# Patient Record
Sex: Female | Born: 1985 | Marital: Married | Smoking: Never smoker
Health system: Southern US, Community
[De-identification: ages and names within clinical notes are randomized; demographics above are authoritative.]

---

## 2015-08-18 DIAGNOSIS — K589 Irritable bowel syndrome without diarrhea: Secondary | ICD-10-CM | POA: Insufficient documentation

## 2016-10-17 DIAGNOSIS — L409 Psoriasis, unspecified: Secondary | ICD-10-CM | POA: Insufficient documentation

## 2018-01-27 ENCOUNTER — Ambulatory Visit: Payer: Self-pay | Admitting: Family Medicine

## 2018-01-27 VITALS — BP 95/75 | HR 77 | Temp 98.2°F | Resp 18 | Wt 161.6 lb

## 2018-01-27 DIAGNOSIS — R05 Cough: Secondary | ICD-10-CM

## 2018-01-27 DIAGNOSIS — R059 Cough, unspecified: Secondary | ICD-10-CM

## 2018-01-27 DIAGNOSIS — R0789 Other chest pain: Secondary | ICD-10-CM

## 2018-01-27 MED ORDER — MONTELUKAST SODIUM 10 MG PO TABS: 10.0000 mg | ORAL_TABLET | Freq: Every day | ORAL | 0 refills | Status: DC

## 2018-01-27 NOTE — Patient Instructions (Addendum)
PLAN< Obtain Primary Care Provider for eval and treatment  Allergic Rhinitis, Adult Allergic rhinitis is an allergic reaction that affects the mucous membrane inside the nose. It causes sneezing, a runny or stuffy nose, and the feeling of mucus going down the back of the throat (postnasal drip). Allergic rhinitis can be mild to severe. There are two types of allergic rhinitis:  Seasonal. This type is also called hay fever. It happens only during certain seasons.  Perennial. This type can happen at any time of the year.  What are the causes? This condition happens when the body's defense system (immune system) responds to certain harmless substances called allergens as though they were germs.  Seasonal allergic rhinitis is triggered by pollen, which can come from grasses, trees, and weeds. Perennial allergic rhinitis may be caused by:  House dust mites.  Pet dander.  Mold spores.  What are the signs or symptoms? Symptoms of this condition include:  Sneezing.  Runny or stuffy nose (nasal congestion).  Postnasal drip.  Itchy nose.  Tearing of the eyes.  Trouble sleeping.  Daytime sleepiness.  How is this diagnosed? This condition may be diagnosed based on:  Your medical history.  A physical exam.  Tests to check for related conditions, such as: ? Asthma. ? Pink eye. ? Ear infection. ? Upper respiratory infection.  Tests to find out which allergens trigger your symptoms. These may include skin or blood tests.  How is this treated? There is no cure for this condition, but treatment can help control symptoms. Treatment may include:  Taking medicines that block allergy symptoms, such as antihistamines. Medicine may be given as a shot, nasal spray, or pill.  Avoiding the allergen.  Desensitization. This treatment involves getting ongoing shots until your body becomes less sensitive to the allergen. This treatment may be done if other treatments do not help.  If  taking medicine and avoiding the allergen does not work, new, stronger medicines may be prescribed.  Follow these instructions at home:  Find out what you are allergic to. Common allergens include smoke, dust, and pollen.  Avoid the things you are allergic to. These are some things you can do to help avoid allergens: ? Replace carpet with wood, tile, or vinyl flooring. Carpet can trap dander and dust. ? Do not smoke. Do not allow smoking in your home. ? Change your heating and air conditioning filter at least once a month. ? During allergy season:  Keep windows closed as much as possible.  Plan outdoor activities when pollen counts are lowest. This is usually during the evening hours.  When coming indoors, change clothing and shower before sitting on furniture or bedding.  Take over-the-counter and prescription medicines only as told by your health care provider.  Keep all follow-up visits as told by your health care provider. This is important. Contact a health care provider if:  You have a fever.  You develop a persistent cough.  You make whistling sounds when you breathe (you wheeze).  Your symptoms interfere with your normal daily activities. Get help right away if:  You have shortness of breath. Summary  This condition can be managed by taking medicines as directed and avoiding allergens.  Contact your health care provider if you develop a persistent cough or fever.  During allergy season, keep windows closed as much as possible. This information is not intended to replace advice given to you by your health care provider. Make sure you discuss any questions you have with  your health care provider. Document Released: 02/28/2001 Document Revised: 07/13/2016 Document Reviewed: 07/13/2016 Elsevier Interactive Patient Education  2018 [employer] Chest Wall Pain Chest wall pain is pain in or around the bones and muscles of your chest. Sometimes, an injury causes this  pain. Sometimes, the cause may not be known. This pain may take several weeks or longer to get better. Follow these instructions at home: Pay attention to any changes in your symptoms. Take these actions to help with your pain:  Rest as told by your doctor.  Avoid activities that cause pain. Try not to use your chest, belly (abdominal), or side muscles to lift heavy things.  If directed, apply ice to the painful area: ? Put ice in a plastic bag. ? Place a towel between your skin and the bag. ? Leave the ice on for 20 minutes, 2-3 times per day.  Take over-the-counter and prescription medicines only as told by your doctor.  Do not use tobacco products, including cigarettes, chewing tobacco, and e-cigarettes. If you need help quitting, ask your doctor.  Keep all follow-up visits as told by your doctor. This is important.  Contact a doctor if:  You have a fever.  Your chest pain gets worse.  You have new symptoms. Get help right away if:  You feel sick to your stomach (nauseous) or you throw up (vomit).  You feel sweaty or light-headed.  You have a cough with phlegm (sputum) or you cough up blood.  You are short of breath. This information is not intended to replace advice given to you by your health care provider. Make sure you discuss any questions you have with your health care provider. Document Released: 11/22/2007 Document Revised: 11/11/2015 Document Reviewed: 08/31/2014 Elsevier Interactive Patient Education  [employer].

## 2018-01-27 NOTE — Progress Notes (Signed)
Amber MortonJasmine Luna is a 32 y.o. female who presents today with concerns of cough that has been persistent for approximately 1 month and then intermittent chest pain that has been intermittent for 6 weeks. She denies any trauma, increase in physical activity, GERD or recent illness or medication treatment.  Review of Systems  Constitutional: Negative for chills, fever and malaise/fatigue.  HENT: Negative for congestion, ear discharge, ear pain, sinus pain and sore throat.   Eyes: Negative.   Respiratory: Negative for cough, sputum production and shortness of breath.   Cardiovascular: Negative.  Negative for chest pain.  Gastrointestinal: Negative for abdominal pain, diarrhea, nausea and vomiting.  Genitourinary: Negative for dysuria, frequency, hematuria and urgency.  Musculoskeletal: Negative for myalgias.  Skin: Negative.   Neurological: Negative for headaches.  Endo/Heme/Allergies: Negative.   Psychiatric/Behavioral: Negative.     O: Vitals:   01/27/18 1523  BP: 95/75  Pulse: 77  Resp: 18  Temp: 98.2 F (36.8 C)  SpO2: 98%     Physical Exam  Constitutional: She is oriented to person, place, and time. Vital signs are normal. She appears well-developed and well-nourished. She is active.  Non-toxic appearance. She does not have a sickly appearance.  HENT:  Head: Normocephalic.  Right Ear: Hearing, tympanic membrane, external ear and ear canal normal.  Left Ear: Hearing, tympanic membrane, external ear and ear canal normal.  Nose: Nose normal.  Mouth/Throat: Uvula is midline and oropharynx is clear and moist.  Neck: Normal range of motion. Neck supple.  Cardiovascular: Normal rate, regular rhythm, normal heart sounds and normal pulses.  Pulmonary/Chest: Effort normal and breath sounds normal.  Abdominal: Soft. Bowel sounds are normal.  Musculoskeletal: Normal range of motion.  Lymphadenopathy:       Head (right side): No submental and no submandibular adenopathy present.   Head (left side): No submental and no submandibular adenopathy present.    She has no cervical adenopathy.  Neurological: She is alert and oriented to person, place, and time.  Psychiatric: She has a normal mood and affect.  Vitals reviewed.  A: 1. Cough   2. Acute chest wall pain    P: Discussed exam findings, diagnosis etiology and medication use and indications reviewed with patient. Follow- Up and discharge instructions provided. No emergent/urgent issues found on exam.  Patient verbalized understanding of information provided and agrees with plan of care (POC), all questions answered.  1. Cough - montelukast (SINGULAIR) 10 MG tablet; Take 1 tablet (10 mg total) by mouth at bedtime.  2. Acute chest wall pain Follow up with PCP for more comprehensive evaluation and treatment not acute concerns or findings on exam today. Discussed potential differential diagnosis with patient and spouse. Given information on local providers who are accepting new patients.  Other orders - Multiple Vitamin (MULTIVITAMIN) tablet; Take 1 tablet by mouth daily.

## 2018-01-29 ENCOUNTER — Telehealth: Payer: Self-pay

## 2018-01-29 NOTE — Telephone Encounter (Signed)
I called the patient but I was not able to contacted the patient.

## 2018-03-07 ENCOUNTER — Ambulatory Visit: Payer: BLUE CROSS/BLUE SHIELD | Admitting: Psychology

## 2018-03-07 DIAGNOSIS — F411 Generalized anxiety disorder: Secondary | ICD-10-CM

## 2018-04-20 ENCOUNTER — Encounter (HOSPITAL_COMMUNITY): Payer: Self-pay | Admitting: Emergency Medicine

## 2018-04-20 ENCOUNTER — Emergency Department (HOSPITAL_COMMUNITY)
Admission: EM | Admit: 2018-04-20 | Discharge: 2018-04-20 | Disposition: A | Discharge status: Home / Self Care | Payer: BLUE CROSS/BLUE SHIELD | Attending: Emergency Medicine | Admitting: Emergency Medicine

## 2018-04-20 ENCOUNTER — Emergency Department (HOSPITAL_COMMUNITY): Payer: BLUE CROSS/BLUE SHIELD

## 2018-04-20 DIAGNOSIS — Z79899 Other long term (current) drug therapy: Secondary | ICD-10-CM | POA: Diagnosis not present

## 2018-04-20 DIAGNOSIS — R079 Chest pain, unspecified: Secondary | ICD-10-CM | POA: Diagnosis present

## 2018-04-20 DIAGNOSIS — M940 Chondrocostal junction syndrome [Tietze]: Secondary | ICD-10-CM | POA: Diagnosis not present

## 2018-04-20 LAB — BASIC METABOLIC PANEL
Anion gap: 10 (ref 5–15)
BUN: 6 mg/dL (ref 6–20)
CALCIUM: 9.2 mg/dL (ref 8.9–10.3)
CO2: 22 mmol/L (ref 22–32)
Chloride: 103 mmol/L (ref 98–111)
Creatinine, Ser: 0.59 mg/dL (ref 0.44–1.00)
GLUCOSE: 89 mg/dL (ref 70–99)
Potassium: 3.4 mmol/L — ABNORMAL LOW (ref 3.5–5.1)
Sodium: 135 mmol/L (ref 135–145)

## 2018-04-20 LAB — CBC
HCT: 42.9 % (ref 36.0–46.0)
HEMOGLOBIN: 14.2 g/dL (ref 12.0–15.0)
MCH: 30.3 pg (ref 26.0–34.0)
MCHC: 33.1 g/dL (ref 30.0–36.0)
MCV: 91.5 fL (ref 80.0–100.0)
PLATELETS: 400 10*3/uL (ref 150–400)
RBC: 4.69 MIL/uL (ref 3.87–5.11)
RDW: 11.9 % (ref 11.5–15.5)
WBC: 7.4 10*3/uL (ref 4.0–10.5)
nRBC: 0 % (ref 0.0–0.2)

## 2018-04-20 LAB — I-STAT TROPONIN, ED: TROPONIN I, POC: 0.01 ng/mL (ref 0.00–0.08)

## 2018-04-20 LAB — I-STAT BETA HCG BLOOD, ED (MC, WL, AP ONLY)

## 2018-04-20 NOTE — ED Provider Notes (Signed)
[facility] EMERGENCY DEPARTMENT Provider Note   CSN: [id number] Arrival date & time: 04/20/18  1645     History   Chief Complaint Chief Complaint  Patient presents with  . Chest Pain    HPI Amber Luna is a 32 y.o. female.  Patient is a 33 year old female presenting with chest pain.  PMH significant for anxiety, thyroid nodules, asthma.  Patient reports intermittent sharp chest pain localized to left chest wall since yesterday.  She also recalls having a similar pain on the right chest wall.  Pain was not worse with exertion and did not improve with rest.  She did recently workout at the gym with her husband yesterday and did not experience chest pain.  He has no prior history of chest pain.  She denies associated shortness of breath or palpitations with her pain.  She has not tried any medications to alleviate the pain.  She did recently start gabapentin and Lamictal for anxiety by her psychiatrist.  Patient recently moved from [place] but has not yet established with a PCP here.  Patient is a non-smoker and denies history of illicit drug use.  She occasionally drinks alcohol.  She currently is pursuing a career in Social worker and denies recent increased stress to her life.  Patient denies history of blood disorder, long travel, surgery.     History reviewed. No pertinent past medical history.  There are no active problems to display for this patient.   History reviewed. No pertinent surgical history.   OB History   None      Home Medications    Prior to Admission medications   Medication Sig Start Date End Date Taking? Authorizing Provider  montelukast (SINGULAIR) 10 MG tablet Take 1 tablet (10 mg total) by mouth at bedtime. 01/27/18   Zachery Dauer, NP  Multiple Vitamin (MULTIVITAMIN) tablet Take 1 tablet by mouth daily.    [provider]    Family History No family history on file.  Social History Social History   Tobacco Use  .  Smoking status: Not on file  Substance Use Topics  . Alcohol use: Not on file  . Drug use: Not on file     Allergies   Patient has no known allergies.   Review of Systems Review of Systems  Constitutional: Negative for chills and fever.  HENT: Negative for ear pain and sore throat.   Eyes: Negative for pain and visual disturbance.  Respiratory: Negative for cough and shortness of breath.   Cardiovascular: Positive for chest pain. Negative for palpitations.  Gastrointestinal: Negative for abdominal pain and vomiting.  Genitourinary: Negative for dysuria and hematuria.  Musculoskeletal: Negative for arthralgias and myalgias.  Skin: Negative for color change and rash.  Neurological: Negative for dizziness, light-headedness and headaches.  Psychiatric/Behavioral: The patient is not nervous/anxious.   All other systems reviewed and are negative.    Physical Exam Updated Vital Signs BP 125/85 (BP Location: Right Arm)   Pulse 75   Temp 97.9 F (36.6 C) (Oral)   Resp 16   Ht 5\' 5"  (1.651 m)   Wt 71.7 kg   LMP 04/16/2018   SpO2 100%   BMI 26.29 kg/m   Physical Exam  Constitutional: She appears well-developed and well-nourished. No distress.  HENT:  Head: Normocephalic and atraumatic.  Eyes: Conjunctivae are normal.  Neck: Neck supple.  Cardiovascular: Normal rate and regular rhythm.  No murmur heard. Tenderness to palpation of left chest wall  Pulmonary/Chest: Effort normal and  breath sounds normal. No respiratory distress.  Abdominal: Soft. There is no tenderness.  Musculoskeletal: She exhibits no edema.  Neurological: She is alert.  Skin: Skin is warm and dry.  Psychiatric: She has a normal mood and affect.  Nursing note and vitals reviewed.    ED Treatments / Results  Labs (all labs ordered are listed, but only abnormal results are displayed) Labs Reviewed  BASIC METABOLIC PANEL - Abnormal; Notable for the following components:      Result Value    Potassium 3.4 (*)    All other components within normal limits  CBC  I-STAT TROPONIN, ED  I-STAT BETA HCG BLOOD, ED ([facility], [facility], [facility] ONLY)    EKG EKG Interpretation  Date/Time:  Saturday April 20 2018 16:52:58 EDT Ventricular Rate:  83 PR Interval:  150 QRS Duration: 78 QT Interval:  388 QTC Calculation: 455 R Axis:   68 Text Interpretation:  Normal sinus rhythm with sinus arrhythmia Normal ECG Confirmed by Blane Ohara [phone] on 04/20/2018 7:04:52 PM   Radiology Dg Chest 2 View  Result Date: 04/20/2018 CLINICAL DATA:  Left-sided chest pain. Shortness of breath for 2 days. EXAM: CHEST - 2 VIEW COMPARISON:  None. FINDINGS: The heart size and mediastinal contours are within normal limits. Both lungs are clear. The visualized skeletal structures are unremarkable. IMPRESSION: No active cardiopulmonary disease. Electronically Signed   By: Gerome Sam III M.D   On: 04/20/2018 17:35    Procedures Procedures (including critical care time)  Medications Ordered in ED Medications - No data to display   Initial Impression / Assessment and Plan / ED Course  I have reviewed the triage vital signs and the nursing notes.  Pertinent labs & imaging results that were available during my care of the patient were reviewed by me and considered in my medical decision making (see chart for details).  Patient is a 32 year old female presenting with chest pain.  PMH significant for anxiety, thyroid nodules, asthma.  Patient presenting with intermittent atypical chest pain.  Suspicious for MSK etiology.  Wells score 0 making PE unlikely.  Symptoms not consistent with ACS.  Patient appears comfortable during interview and is laughing on exam.  She does report continuation of chest pain during exam.  Reviewed return precautions and advised patient to take NSAIDs as needed for chest pain.  Patient instructed to establish with PCP.  Patient offered to establish with me during exam.  Final Clinical  Impressions(s) / ED Diagnoses   Final diagnoses:  Costochondritis    ED Discharge Orders    None       Wendee Beavers, DO 04/20/18 2240    Blane Ohara, MD 04/20/18 2351

## 2018-04-20 NOTE — Discharge Instructions (Addendum)
Your pain is likely related to costochondritis which is an irritation of the cartilage of the chest wall.  You can consider ibuprofen or naproxen for pain relief as needed.    Feel free to contact the family medicine clinic at [phone] request to establish with Dr. Abelardo Diesel.  I would be happy to follow-up with you if you choose to establish with [place] based on your insurance.

## 2018-04-20 NOTE — ED Notes (Signed)
Patient verbalizes understanding of discharge instructions. Opportunity for questioning and answers were provided. Ambulatory at discharge in NAD.  

## 2018-04-20 NOTE — ED Triage Notes (Signed)
Pt states she has had intermittent left sided chest soreness that is tender to palpation since yesterday. The pain started getting worse 2 hours ago. Pt also has some nausea and feeling flushed.

## 2018-07-19 ENCOUNTER — Emergency Department (HOSPITAL_COMMUNITY): Payer: 59

## 2018-07-19 ENCOUNTER — Other Ambulatory Visit: Payer: Self-pay

## 2018-07-19 ENCOUNTER — Emergency Department (HOSPITAL_COMMUNITY)
Admission: EM | Admit: 2018-07-19 | Discharge: 2018-07-19 | Disposition: A | Discharge status: Home / Self Care | Payer: 59 | Attending: Emergency Medicine | Admitting: Emergency Medicine

## 2018-07-19 DIAGNOSIS — Z79899 Other long term (current) drug therapy: Secondary | ICD-10-CM | POA: Diagnosis not present

## 2018-07-19 DIAGNOSIS — R140 Abdominal distension (gaseous): Secondary | ICD-10-CM

## 2018-07-19 DIAGNOSIS — R109 Unspecified abdominal pain: Secondary | ICD-10-CM | POA: Diagnosis present

## 2018-07-19 DIAGNOSIS — K8020 Calculus of gallbladder without cholecystitis without obstruction: Secondary | ICD-10-CM | POA: Diagnosis not present

## 2018-07-19 LAB — COMPREHENSIVE METABOLIC PANEL
ALBUMIN: 4.1 g/dL (ref 3.5–5.0)
ALK PHOS: 30 U/L — AB (ref 38–126)
ALT: 16 U/L (ref 0–44)
ANION GAP: 10 (ref 5–15)
AST: 17 U/L (ref 15–41)
BUN: 8 mg/dL (ref 6–20)
CO2: 23 mmol/L (ref 22–32)
Calcium: 9.3 mg/dL (ref 8.9–10.3)
Chloride: 106 mmol/L (ref 98–111)
Creatinine, Ser: 0.64 mg/dL (ref 0.44–1.00)
GFR calc Af Amer: >60 mL/min (ref >60)
GFR calc non Af Amer: >60 mL/min (ref >60)
GLUCOSE: 92 mg/dL (ref 70–99)
Potassium: 3.9 mmol/L (ref 3.5–5.1)
Sodium: 139 mmol/L (ref 135–145)
Total Bilirubin: 0.5 mg/dL (ref 0.3–1.2)
Total Protein: 7.2 g/dL (ref 6.5–8.1)

## 2018-07-19 LAB — URINALYSIS, ROUTINE W REFLEX MICROSCOPIC
BILIRUBIN URINE: NEGATIVE
Glucose, UA: NEGATIVE mg/dL
Hgb urine dipstick: NEGATIVE
KETONES UR: 20 mg/dL — AB
LEUKOCYTES UA: NEGATIVE
NITRITE: NEGATIVE
PROTEIN: NEGATIVE mg/dL
Specific Gravity, Urine: 1.011 (ref 1.005–1.030)
pH: 8 (ref 5.0–8.0)

## 2018-07-19 LAB — CBC WITH DIFFERENTIAL/PLATELET
Abs Immature Granulocytes: 0.01 10*3/uL (ref 0.00–0.07)
BASOS ABS: 0 10*3/uL (ref 0.0–0.1)
BASOS PCT: 1 %
EOS ABS: 0.1 10*3/uL (ref 0.0–0.5)
Eosinophils Relative: 1 %
HEMATOCRIT: 40.3 % (ref 36.0–46.0)
Hemoglobin: 13.6 g/dL (ref 12.0–15.0)
IMMATURE GRANULOCYTES: 0 %
LYMPHS ABS: 2.2 10*3/uL (ref 0.7–4.0)
Lymphocytes Relative: 33 %
MCH: 30.6 pg (ref 26.0–34.0)
MCHC: 33.7 g/dL (ref 30.0–36.0)
MCV: 90.6 fL (ref 80.0–100.0)
Monocytes Absolute: 0.5 10*3/uL (ref 0.1–1.0)
Monocytes Relative: 7 %
NEUTROS PCT: 58 %
NRBC: 0 % (ref 0.0–0.2)
Neutro Abs: 3.9 10*3/uL (ref 1.7–7.7)
PLATELETS: 371 10*3/uL (ref 150–400)
RBC: 4.45 MIL/uL (ref 3.87–5.11)
RDW: 11.6 % (ref 11.5–15.5)
WBC: 6.6 10*3/uL (ref 4.0–10.5)

## 2018-07-19 LAB — LIPASE, BLOOD: Lipase: 50 U/L (ref 11–51)

## 2018-07-19 LAB — I-STAT BETA HCG BLOOD, ED (MC, WL, AP ONLY)

## 2018-07-19 MED ORDER — ONDANSETRON HCL 4 MG PO TABS: 4.0000 mg | ORAL_TABLET | Freq: Three times a day (TID) | ORAL | 0 refills | Status: DC | PRN

## 2018-07-19 MED ORDER — ONDANSETRON HCL 4 MG/2ML IJ SOLN: 4.0000 mg | INTRAMUSCULAR | Status: DC

## 2018-07-19 MED ORDER — ONDANSETRON 4 MG PO TBDP
ORAL_TABLET | ORAL | Status: AC
  Filled 2018-07-19: qty 1

## 2018-07-19 MED ORDER — IOHEXOL 300 MG/ML  SOLN
100.0000 mL | Freq: Once | INTRAMUSCULAR | Status: AC | PRN
  Administered 2018-07-19: 100 mL via INTRAVENOUS

## 2018-07-19 MED ORDER — DICYCLOMINE HCL 10 MG PO CAPS
10.0000 mg | ORAL_CAPSULE | Freq: Once | ORAL | Status: AC
  Administered 2018-07-19: 10 mg via ORAL
  Filled 2018-07-19: qty 1

## 2018-07-19 MED ORDER — HYOSCYAMINE SULFATE 0.125 MG PO TABS
0.1250 mg | ORAL_TABLET | Freq: Once | ORAL | Status: DC
  Filled 2018-07-19: qty 1

## 2018-07-19 MED ORDER — HYOSCYAMINE SULFATE SL 0.125 MG SL SUBL: 0.1250 mg | SUBLINGUAL_TABLET | SUBLINGUAL | 0 refills | Status: DC | PRN

## 2018-07-19 MED ORDER — ONDANSETRON 4 MG PO TBDP
4.0000 mg | ORAL_TABLET | Freq: Once | ORAL | Status: AC
  Administered 2018-07-19: 4 mg via ORAL

## 2018-07-19 NOTE — ED Triage Notes (Signed)
Pt here from home with c/o abd bloating that started yesterday , pt has hx of IBS

## 2018-07-19 NOTE — ED Notes (Signed)
Pt given zofran and bentyl per provider order. Discharge instructions give with teach back. Pt left in stable condition with family member.

## 2018-07-19 NOTE — ED Provider Notes (Signed)
[facility] Provider Note   CSN: [id number] Arrival date & time: 07/19/18  1545     History   Chief Complaint No chief complaint on file.   HPI [name] is a 33 y.o. female who presents the emergency department with chief complaint of abdominal distention.  Patient states that for several months she has had episodes of abdominal cramping, mucousy and bloody stool, episodes of diarrhea and constipation.  Patient states that she did not put much thought into it because she was diagnosed many years ago with IBS.  She also states she has a history of colonic polyps.  She is unsure if she has a family history of colon cancer.  The patient states that she has had increasing abdominal distention, last night she felt like she was unable to pass gas but she did make a bowel movement with some firm stool today.  She had associated abdominal cramps and rectal spasms.  She denies fevers, chills, weight loss, nausea or vomiting.  She states that her abdomen is significantly distended today and that normally it is flat.  She denies urinary symptoms.  She gets regular OB/GYN care and had a work-up a month ago that was normal.  HPI  No past medical history on file.  There are no active problems to display for this patient.   No past surgical history on file.   OB History   No obstetric history on file.      Home Medications    Prior to Admission medications   Medication Sig Start Date End Date Taking? Authorizing Provider  montelukast (SINGULAIR) 10 MG tablet Take 1 tablet (10 mg total) by mouth at bedtime. 01/27/18   Zachery DauerGraham, Elysa, NP  Multiple Vitamin (MULTIVITAMIN) tablet Take 1 tablet by mouth daily.    [provider]    Family History No family history on file.  Social History Social History   Tobacco Use  . Smoking status: Not on file  Substance Use Topics  . Alcohol use: Not on file  . Drug use: Not on file      Allergies   Patient has no known allergies.   Review of Systems Review of Systems  Ten systems reviewed and are negative for acute change, except as noted in the HPI.   Physical Exam Updated Vital Signs BP 122/87 (BP Location: Right Arm)   Pulse 85   Temp 98.6 F (37 C) (Oral)   Resp 16   SpO2 100%   Physical Exam Vitals signs and nursing note reviewed.  Constitutional:      General: She is not in acute distress.    Appearance: She is well-developed. She is not diaphoretic.  HENT:     Head: Normocephalic and atraumatic.  Eyes:     General: No scleral icterus.    Conjunctiva/sclera: Conjunctivae normal.  Neck:     Musculoskeletal: Normal range of motion.  Cardiovascular:     Rate and Rhythm: Normal rate and regular rhythm.     Heart sounds: Normal heart sounds. No murmur. No friction rub. No gallop.   Pulmonary:     Effort: Pulmonary effort is normal. No respiratory distress.     Breath sounds: Normal breath sounds.  Abdominal:     General: Bowel sounds are normal. There is distension.     Palpations: Abdomen is soft. There is no mass.     Tenderness: There is no abdominal tenderness. There is no guarding.     Hernia:  No hernia is present.    Skin:    General: Skin is warm and dry.  Neurological:     Mental Status: She is alert and oriented to person, place, and time.  Psychiatric:        Behavior: Behavior normal.      ED Treatments / Results  Labs (all labs ordered are listed, but only abnormal results are displayed) Labs Reviewed  COMPREHENSIVE METABOLIC PANEL  LIPASE, BLOOD  CBC WITH DIFFERENTIAL/PLATELET  URINALYSIS, ROUTINE W REFLEX MICROSCOPIC  I-STAT BETA HCG BLOOD, ED ([facility], [facility], [facility] ONLY)    EKG None  Radiology No results found.  Procedures Procedures (including critical care time)  Medications Ordered in ED Medications - No data to display   Initial Impression / Assessment and Plan / ED Course  I have reviewed the triage  vital signs and the nursing notes.  Pertinent labs & imaging results that were available during my care of the patient were reviewed by me and considered in my medical decision making (see chart for details).    33 year old female with abdominal pain, blood and mucousy stools.The causes for generalized abdominal pain include but are not limited to gastritis, gastroenteritis, IBS, pancreatitis, peritonitis, intestinal ischemia, constipation, UTI, intestinal obstruction, perforated viscus, eg, peptic ulcer, appendix, gallbladder, diverticulitis, physical or sexual abuse, abdominal abscess, ruptured ectopic pregnancy, ruptured spleen, AAA, diabetic ketoacidosis, hypercalcemia, uremia, parasitic or other infection, eg: tapeworms, flukes, Giargia, Cryto, Yersinia, adrenal insufficiency,lead poisoning, iron toxicity, polyarteritis nodosa, Henoch-Schnlein purpura, porphyria, eg, acute intermittent porphyria, familial Mediterranean fever.  CT scan of the abdomen is without findings that would account for her symptoms.  There are some inflammatory changes of the buttocks.  Patient states that she has been getting injections previously this does not reflect cellulitis..  Review of her labs shows no significant abnormalities.  She has no elevated white blood cell count.  No sign of inflammatory bowel disease. CT scan and plain film of the abdomen does show cholelithiasis.  She does not have any tenderness in the right upper quadrant to suggest cholecystitis and her liver enzymes are without elevation.  Patient is appropriate for discharge at this time.  She will be given antispasmodics and nausea medication.  She is advised to follow closely with GI for outpatient follow-up and possible colonoscopy.  Final Clinical Impressions(s) / ED Diagnoses   Final diagnoses:  Abdominal distension  Calculus of gallbladder without cholecystitis without obstruction    ED Discharge Orders    None       Arthor Captain,  PA-C 07/19/18 2327    Mesner, Barbara Cower, MD 07/19/18 2332

## 2018-07-19 NOTE — Discharge Instructions (Signed)
Contact a health care provider if: Your symptoms do not improve. Get help right away if: Your bleeding increases. You feel light-headed or you faint. You feel weak. You have severe cramps in your back or abdomen. You pass large blood clots in your stool. Your symptoms get worse.

## 2018-11-18 ENCOUNTER — Inpatient Hospital Stay: Admission: RE | Admit: 2018-11-18 | Payer: 59 | Source: Ambulatory Visit

## 2018-11-21 ENCOUNTER — Other Ambulatory Visit: Payer: Self-pay | Admitting: Gastroenterology

## 2018-11-21 ENCOUNTER — Ambulatory Visit
Admission: RE | Admit: 2018-11-21 | Discharge: 2018-11-21 | Disposition: A | Discharge status: Home / Self Care | Payer: 59 | Source: Ambulatory Visit | Attending: Gastroenterology | Admitting: Gastroenterology

## 2018-11-21 DIAGNOSIS — R1084 Generalized abdominal pain: Secondary | ICD-10-CM

## 2018-11-21 MED ORDER — IOPAMIDOL (ISOVUE-300) INJECTION 61%
100.0000 mL | Freq: Once | INTRAVENOUS | Status: AC | PRN
  Administered 2018-11-21: 100 mL via INTRAVENOUS

## 2019-07-01 ENCOUNTER — Ambulatory Visit: Payer: Self-pay | Admitting: Adult Health Nurse Practitioner

## 2019-07-03 ENCOUNTER — Telehealth: Payer: 59

## 2019-07-08 ENCOUNTER — Encounter: Payer: Self-pay | Admitting: Adult Health Nurse Practitioner

## 2019-07-08 ENCOUNTER — Ambulatory Visit: Payer: 59 | Admitting: Adult Health Nurse Practitioner

## 2019-07-08 ENCOUNTER — Other Ambulatory Visit: Payer: Self-pay

## 2019-07-08 VITALS — BP 110/72 | HR 91 | Temp 98.0°F | Wt 148.2 lb

## 2019-07-08 DIAGNOSIS — I499 Cardiac arrhythmia, unspecified: Secondary | ICD-10-CM

## 2019-07-08 DIAGNOSIS — F909 Attention-deficit hyperactivity disorder, unspecified type: Secondary | ICD-10-CM

## 2019-07-08 DIAGNOSIS — R6889 Other general symptoms and signs: Secondary | ICD-10-CM | POA: Insufficient documentation

## 2019-07-08 DIAGNOSIS — M6281 Muscle weakness (generalized): Secondary | ICD-10-CM

## 2019-07-08 DIAGNOSIS — R351 Nocturia: Secondary | ICD-10-CM | POA: Insufficient documentation

## 2019-07-08 DIAGNOSIS — R5383 Other fatigue: Secondary | ICD-10-CM

## 2019-07-08 DIAGNOSIS — Z8639 Personal history of other endocrine, nutritional and metabolic disease: Secondary | ICD-10-CM | POA: Insufficient documentation

## 2019-07-08 DIAGNOSIS — Z7689 Persons encountering health services in other specified circumstances: Secondary | ICD-10-CM | POA: Diagnosis not present

## 2019-07-08 DIAGNOSIS — R21 Rash and other nonspecific skin eruption: Secondary | ICD-10-CM | POA: Diagnosis not present

## 2019-07-08 DIAGNOSIS — R61 Generalized hyperhidrosis: Secondary | ICD-10-CM | POA: Insufficient documentation

## 2019-07-08 DIAGNOSIS — I739 Peripheral vascular disease, unspecified: Secondary | ICD-10-CM

## 2019-07-08 DIAGNOSIS — L409 Psoriasis, unspecified: Secondary | ICD-10-CM

## 2019-07-08 HISTORY — DX: Other fatigue: R53.83

## 2019-07-08 LAB — POCT URINALYSIS DIP (MANUAL ENTRY)
Bilirubin, UA: NEGATIVE
Blood, UA: NEGATIVE
Glucose, UA: NEGATIVE mg/dL
Ketones, POC UA: NEGATIVE mg/dL
Leukocytes, UA: NEGATIVE
Nitrite, UA: NEGATIVE
Protein Ur, POC: NEGATIVE mg/dL
Spec Grav, UA: 1.02 (ref 1.010–1.025)
Urobilinogen, UA: 0.2 E.U./dL
pH, UA: 7 (ref 5.0–8.0)

## 2019-07-08 NOTE — Patient Instructions (Addendum)
   If you have lab work done today you will be contacted with your lab results within the next 2 weeks.  If you have not heard from us then please contact us. The fastest way to get your results is to register for My Chart.   IF you received an x-ray today, you will receive an invoice from [facility]. Please contact [facility] at [phone] with questions or concerns regarding your invoice.   IF you received labwork today, you will receive an invoice from [facility]. Please contact [facility] at [phone] with questions or concerns regarding your invoice.   Our billing staff will not be able to assist you with questions regarding bills from these companies.  You will be contacted with the lab results as soon as they are available. The fastest way to get your results is to activate your My Chart account. Instructions are located on the last page of this paperwork. If you have not heard from us regarding the results in 2 weeks, please contact this office.     Fatigue If you have fatigue, you feel tired all the time and have a lack of energy or a lack of motivation. Fatigue may make it difficult to start or complete tasks because of exhaustion. In general, occasional or mild fatigue is often a normal response to activity or life. However, long-lasting (chronic) or extreme fatigue may be a symptom of a medical condition. Follow these instructions at home: General instructions  Watch your fatigue for any changes.  Go to bed and get up at the same time every day.  Avoid fatigue by pacing yourself during the day and getting enough sleep at night.  Maintain a healthy weight. Medicines  Take over-the-counter and prescription medicines only as told by your health care provider.  Take a multivitamin, if told by your health care provider.  Do not use herbal or dietary supplements unless they are approved by your health care provider. Activity   Exercise regularly, as  told by your health care provider.  Use or practice techniques to help you relax, such as yoga, tai chi, meditation, or massage therapy. Eating and drinking   Avoid heavy meals in the evening.  Eat a well-balanced diet, which includes lean proteins, whole grains, plenty of fruits and vegetables, and low-fat dairy products.  Avoid consuming too much caffeine.  Avoid the use of alcohol.  Drink enough fluid to keep your urine pale yellow. Lifestyle  Change situations that cause you stress. Try to keep your work and personal schedule in balance.  Do not use any products that contain nicotine or tobacco, such as cigarettes and e-cigarettes. If you need help quitting, ask your health care provider.  Do not use drugs. Contact a health care provider if:  Your fatigue does not get better.  You have a fever.  You suddenly lose or gain weight.  You have headaches.  You have trouble falling asleep or sleeping through the night.  You feel angry, guilty, anxious, or sad.  You are unable to have a bowel movement (constipation).  Your skin is dry.  You have swelling in your legs or another part of your body. Get help right away if:  You feel confused.  Your vision is blurry.  You feel faint or you pass out.  You have a severe headache.  You have severe pain in your abdomen, your back, or the area between your waist and hips (pelvis).  You have chest pain, shortness of breath,   or an irregular or fast heartbeat.  You are unable to urinate, or you urinate less than normal.  You have abnormal bleeding, such as bleeding from the rectum, vagina, nose, lungs, or nipples.  You vomit blood.  You have thoughts about hurting yourself or others. If you ever feel like you may hurt yourself or others, or have thoughts about taking your own life, get help right away. You can go to your nearest emergency department or call:  Your local emergency services (911 in the [place]  A  suicide crisis helpline, such as the [facility] at 352-511-8620. This is open 24 hours a day. Summary  If you have fatigue, you feel tired all the time and have a lack of energy or a lack of motivation.  Fatigue may make it difficult to start or complete tasks because of exhaustion.  Long-lasting (chronic) or extreme fatigue may be a symptom of a medical condition.  Exercise regularly, as told by your health care provider.  Change situations that cause you stress. Try to keep your work and personal schedule in balance. This information is not intended to replace advice given to you by your health care provider. Make sure you discuss any questions you have with your health care provider. Document Revised: 12/25/2018 Document Reviewed: 02/28/2017 Elsevier Patient Education  [place].  Fatigue If you have fatigue, you feel tired all the time and have a lack of energy or a lack of motivation. Fatigue may make it difficult to start or complete tasks because of exhaustion. In general, occasional or mild fatigue is often a normal response to activity or life. However, long-lasting (chronic) or extreme fatigue may be a symptom of a medical condition. Follow these instructions at home: General instructions  Watch your fatigue for any changes.  Go to bed and get up at the same time every day.  Avoid fatigue by pacing yourself during the day and getting enough sleep at night.  Maintain a healthy weight. Medicines  Take over-the-counter and prescription medicines only as told by your health care provider.  Take a multivitamin, if told by your health care provider.  Do not use herbal or dietary supplements unless they are approved by your health care provider. Activity   Exercise regularly, as told by your health care provider.  Use or practice techniques to help you relax, such as yoga, tai chi, meditation, or massage therapy. Eating and  drinking   Avoid heavy meals in the evening.  Eat a well-balanced diet, which includes lean proteins, whole grains, plenty of fruits and vegetables, and low-fat dairy products.  Avoid consuming too much caffeine.  Avoid the use of alcohol.  Drink enough fluid to keep your urine pale yellow. Lifestyle  Change situations that cause you stress. Try to keep your work and personal schedule in balance.  Do not use any products that contain nicotine or tobacco, such as cigarettes and e-cigarettes. If you need help quitting, ask your health care provider.  Do not use drugs. Contact a health care provider if:  Your fatigue does not get better.  You have a fever.  You suddenly lose or gain weight.  You have headaches.  You have trouble falling asleep or sleeping through the night.  You feel angry, guilty, anxious, or sad.  You are unable to have a bowel movement (constipation).  Your skin is dry.  You have swelling in your legs or another part of your body. Get help right away if:  You feel confused.  Your vision is blurry.  You feel faint or you pass out.  You have a severe headache.  You have severe pain in your abdomen, your back, or the area between your waist and hips (pelvis).  You have chest pain, shortness of breath, or an irregular or fast heartbeat.  You are unable to urinate, or you urinate less than normal.  You have abnormal bleeding, such as bleeding from the rectum, vagina, nose, lungs, or nipples.  You vomit blood.  You have thoughts about hurting yourself or others. If you ever feel like you may hurt yourself or others, or have thoughts about taking your own life, get help right away. You can go to your nearest emergency department or call:  Your local emergency services (911 in the [place]  A suicide crisis helpline, such as the [facility] at 847-508-6560. This is open 24 hours a day. Summary  If you have fatigue,  you feel tired all the time and have a lack of energy or a lack of motivation.  Fatigue may make it difficult to start or complete tasks because of exhaustion.  Long-lasting (chronic) or extreme fatigue may be a symptom of a medical condition.  Exercise regularly, as told by your health care provider.  Change situations that cause you stress. Try to keep your work and personal schedule in balance. This information is not intended to replace advice given to you by your health care provider. Make sure you discuss any questions you have with your health care provider. Document Revised: 12/25/2018 Document Reviewed: 02/28/2017 Elsevier Patient Education  [place].  Weakness Weakness is a lack of strength. You may feel weak all over your body (generalized), or you may feel weak in one specific part of your body (focal). Common causes of weakness include:  Infection and immune system disorders.  Physical exhaustion.  Internal bleeding or other blood loss that results in a lack of red blood cells (anemia).  Dehydration.  An imbalance in mineral (electrolyte) levels, such as potassium.  Heart disease, circulation problems, or stroke. Other causes include:  Some medicines or cancer treatment.  Stress, anxiety, or depression.  Nervous system disorders.  Thyroid disorders.  Loss of muscle strength because of age or inactivity.  Poor sleep quality or sleep disorders. The cause of your weakness may not be known. Some causes of weakness can be serious, so it is important to see your health care provider. Follow these instructions at home: Activity  Rest as needed.  Try to get enough sleep. Most adults need 7-8 hours of quality sleep each night. Talk to your health care provider about how much sleep you need each night.  Do exercises, such as arm curls and leg raises, for 30 minutes at least 2 days a week or as told by your health care provider. This helps build muscle  strength.  Consider working with a physical therapist or trainer who can develop an exercise plan to help you gain muscle strength. General instructions   Take over-the-counter and prescription medicines only as told by your health care provider.  Eat a healthy, well-balanced diet. This includes: ? Proteins to build muscles, such as lean meats and fish. ? Fresh fruits and vegetables. ? Carbohydrates to boost energy, such as whole grains.  Drink enough fluid to keep your urine pale yellow.  Keep all follow-up visits as told by your health care provider. This is important. Contact a health care provider if your  weakness:  Does not improve or gets worse.  Affects your ability to think clearly.  Affects your ability to do your normal daily activities. Get help right away if you:  Develop sudden weakness, especially on one side of your face or body.  Have chest pain.  Have trouble breathing or shortness of breath.  Have problems with your vision.  Have trouble talking or swallowing.  Have trouble standing or walking.  Are light-headed or lose consciousness. Summary  Weakness is a lack of strength. You may feel weak all over your body or just in one specific part of your body.  Weakness can be caused by a variety of things. In some cases, the cause may be unknown.  Rest as needed, and try to get enough sleep. Most adults need 7-8 hours of quality sleep each night.  Eat a healthy, well-balanced diet. This information is not intended to replace advice given to you by your health care provider. Make sure you discuss any questions you have with your health care provider. Document Revised: 01/09/2018 Document Reviewed: 01/09/2018 Elsevier Patient Education  [place].

## 2019-07-08 NOTE — Progress Notes (Signed)
Chief Complaint  Patient presents with  . Establish Care    face rash, body pain, trouble sleeping,     HPI   Episodic face rashes, body pain, severe fatigue--slept during the day but felt extremely tired throughout the day--would sleep 10-11 hours while previously rested or 6 or 7.  Anxiety/depression managed by mood treatment center Ativan, panic attacks zoloft   Lower back Bruising over arms and lower extremities,  Little lestions/scabs, toenails get blue with cold,   Feels cold most of the time  06/24/19--llast menstrual cycle,ight, last 2 days   Swollen thyroid, biopsy, no hypo/hyperthyroid for which she is treated  Ras outside in summer heliotrope Weakness, generalized Psoriasis, 4 years, 3 yers ago.   Problem List    Problem List: 2021-01: Fatigue 2021-01: Rash and nonspecific skin eruption 2021-01: Nocturia 2021-01: Night sweat 2021-01: Sensitivity to the cold 2021-01: Peripheral vasoconstriction (HCC) 2021-01: History of thyroid nodule 2021-01: Muscle weakness (generalized) 2021-01: Attention deficit hyperactivity disorder (ADHD) 2021-01: Irregular heart rhythm 2018-05: Psoriasis 2017-03: Irritable bowel syndrome (IBS)   Allergies   has No Known Allergies.  Medications    Current Outpatient Medications:  .  Hyoscyamine Sulfate SL (LEVSIN/SL) 0.125 MG SUBL, Place 0.125 mg under the tongue every 4 (four) hours as needed (cramping). Up to 1.25 mg in a 24 hour period, Disp: 30 each, Rfl: 0 .  montelukast (SINGULAIR) 10 MG tablet, Take 1 tablet (10 mg total) by mouth at bedtime. (Patient not taking: Reported on 07/19/2018), Disp: 30 tablet, Rfl: 0 .  Multiple Vitamin (MULTIVITAMIN) tablet, Take 1 tablet by mouth daily., Disp: , Rfl:  .  ondansetron (ZOFRAN) 4 MG tablet, Take 1 tablet (4 mg total) by mouth every 8 (eight) hours as needed for nausea or vomiting., Disp: 10 tablet, Rfl: 0   Review of Systems    Constitutional: Negative for activity change,  appetite change, chills and fever.  HENT: Negative for congestion, nosebleeds, trouble swallowing and voice change.   Respiratory: Negative for cough, shortness of breath and wheezing.   Gastrointestinal: Negative for diarrhea, nausea and vomiting.  Genitourinary: Negative for difficulty urinating, dysuria, flank pain and hematuria.  Musculoskeletal: Negative for back pain, joint swelling and neck pain.  Neurological: Negative for dizziness, speech difficulty, light-headedness and numbness.  See HPI. All other review of systems negative.     Physical Exam:   Physical Examination: General appearance - alert, well appearing, and in no distress and oriented to person, place, and time Mental status - normal mood, behavior, speech, dress, motor activity, and thought processes Eyes - not examined, Visually impaired -Peter's anomaly Neck - supple, no significant adenopathy, carotids upstroke normal bilaterally, no bruits, thyroid exam: thyroid is normal in size without nodules or tenderness Chest - clear to auscultation, no wheezes, rales or rhonchi, symmetric air entry  Heart - normal rate, regular rhythm, normal S1, S2, no murmurs, rubs, clicks or gallops Extremities - dependent LE edema without clubbing or cyanosis Skin - normal coloration and turgor, no rashes, no suspicious skin lesions noted  No hyperpigmentation of skin.  No current hematomas noted   Lab Review   labs are reviewed, up to date and normal.   Assessment & Plan:  Bilateral dry eye, gets worse at night   1. Fatigue, unspecified type   2. Establishing care with new doctor, encounter for   3. Rash and nonspecific skin eruption   4. Nocturia   5. Night sweat   6. Sensitivity to the cold  7. Peripheral vasoconstriction (HCC)   8. Psoriasis   9. History of thyroid nodule   10. Muscle weakness (generalized)   11. Attention deficit hyperactivity disorder (ADHD), unspecified ADHD type   12. Irregular heart rhythm     Orders Placed This Encounter  Procedures  . Urinalysis, microscopic only  . Sedimentation Rate  . Iron, TIBC and Ferritin Panel  . C-reactive protein  . ANA  . Thyroid Panel With TSH  . Thyroid antibodies  . Microalbumin / creatinine urine ratio  . Ambulatory referral to Cardiology  . Ambulatory referral to Neurology  . POCT urinalysis dipstick   No orders of the defined types were placed in this encounter.    Elyse Jarvis, NP

## 2019-07-09 LAB — IRON,TIBC AND FERRITIN PANEL
Ferritin: 92 ng/mL (ref 15–150)
Iron Saturation: 34 % (ref 15–55)
Iron: 94 ug/dL (ref 27–159)
Total Iron Binding Capacity: 274 ug/dL (ref 250–450)
UIBC: 180 ug/dL (ref 131–425)

## 2019-07-09 LAB — URINALYSIS, MICROSCOPIC ONLY
Bacteria, UA: NONE SEEN
Casts: NONE SEEN /lpf
RBC: NONE SEEN /hpf (ref 0–2)
WBC, UA: NONE SEEN /hpf (ref 0–5)

## 2019-07-09 LAB — ANA: Anti Nuclear Antibody (ANA): NEGATIVE

## 2019-07-09 LAB — MICROALBUMIN / CREATININE URINE RATIO
Creatinine, Urine: 87.6 mg/dL
Microalb/Creat Ratio: 4 mg/g creat (ref 0–29)
Microalbumin, Urine: 3.4 ug/mL

## 2019-07-09 LAB — THYROID ANTIBODIES
Thyroglobulin Antibody: <1 IU/mL (ref 0.0–0.9)
Thyroperoxidase Ab SerPl-aCnc: <9 IU/mL (ref 0–34)

## 2019-07-09 LAB — THYROID PANEL WITH TSH
Free Thyroxine Index: 2 (ref 1.2–4.9)
T3 Uptake Ratio: 26 % (ref 24–39)
T4, Total: 7.5 ug/dL (ref 4.5–12.0)
TSH: 1 u[IU]/mL (ref 0.450–4.500)

## 2019-07-09 LAB — C-REACTIVE PROTEIN: CRP: 1 mg/L (ref 0–10)

## 2019-07-09 LAB — SEDIMENTATION RATE: Sed Rate: 2 mm/hr (ref 0–32)

## 2019-07-15 ENCOUNTER — Encounter: Payer: Self-pay | Admitting: Adult Health Nurse Practitioner

## 2019-11-22 ENCOUNTER — Emergency Department (HOSPITAL_COMMUNITY)
Admission: EM | Admit: 2019-11-22 | Discharge: 2019-11-22 | Disposition: A | Discharge status: Home / Self Care | Payer: 59 | Attending: Emergency Medicine | Admitting: Emergency Medicine

## 2019-11-22 ENCOUNTER — Emergency Department (HOSPITAL_COMMUNITY): Payer: 59

## 2019-11-22 ENCOUNTER — Encounter (HOSPITAL_COMMUNITY): Payer: Self-pay | Admitting: Emergency Medicine

## 2019-11-22 ENCOUNTER — Emergency Department (HOSPITAL_BASED_OUTPATIENT_CLINIC_OR_DEPARTMENT_OTHER): Payer: 59

## 2019-11-22 ENCOUNTER — Other Ambulatory Visit: Payer: Self-pay

## 2019-11-22 DIAGNOSIS — R42 Dizziness and giddiness: Secondary | ICD-10-CM | POA: Diagnosis not present

## 2019-11-22 DIAGNOSIS — R0602 Shortness of breath: Secondary | ICD-10-CM | POA: Diagnosis not present

## 2019-11-22 DIAGNOSIS — Z79899 Other long term (current) drug therapy: Secondary | ICD-10-CM | POA: Diagnosis not present

## 2019-11-22 DIAGNOSIS — R079 Chest pain, unspecified: Secondary | ICD-10-CM | POA: Diagnosis not present

## 2019-11-22 DIAGNOSIS — I82462 Acute embolism and thrombosis of left calf muscular vein: Secondary | ICD-10-CM | POA: Diagnosis not present

## 2019-11-22 DIAGNOSIS — R21 Rash and other nonspecific skin eruption: Secondary | ICD-10-CM | POA: Diagnosis not present

## 2019-11-22 LAB — BASIC METABOLIC PANEL
Anion gap: 6 (ref 5–15)
BUN: 6 mg/dL (ref 6–20)
CO2: 28 mmol/L (ref 22–32)
Calcium: 9.2 mg/dL (ref 8.9–10.3)
Chloride: 106 mmol/L (ref 98–111)
Creatinine, Ser: 0.72 mg/dL (ref 0.44–1.00)
GFR calc Af Amer: >60 mL/min (ref >60)
GFR calc non Af Amer: >60 mL/min (ref >60)
Glucose, Bld: 78 mg/dL (ref 70–99)
Potassium: 3.6 mmol/L (ref 3.5–5.1)
Sodium: 140 mmol/L (ref 135–145)

## 2019-11-22 LAB — CBC
HCT: 42.4 % (ref 36.0–46.0)
Hemoglobin: 14 g/dL (ref 12.0–15.0)
MCH: 31.3 pg (ref 26.0–34.0)
MCHC: 33 g/dL (ref 30.0–36.0)
MCV: 94.6 fL (ref 80.0–100.0)
Platelets: 444 10*3/uL — ABNORMAL HIGH (ref 150–400)
RBC: 4.48 MIL/uL (ref 3.87–5.11)
RDW: 12.7 % (ref 11.5–15.5)
WBC: 6.9 10*3/uL (ref 4.0–10.5)
nRBC: 0 % (ref 0.0–0.2)

## 2019-11-22 LAB — TROPONIN I (HIGH SENSITIVITY)
Troponin I (High Sensitivity): <2 ng/L (ref <18)
Troponin I (High Sensitivity): <2 ng/L (ref <18)

## 2019-11-22 LAB — I-STAT BETA HCG BLOOD, ED (MC, WL, AP ONLY): I-stat hCG, quantitative: <5 m[IU]/mL (ref <5)

## 2019-11-22 LAB — D-DIMER, QUANTITATIVE: D-Dimer, Quant: <0.27 ug/mL-FEU (ref 0.00–0.50)

## 2019-11-22 MED ORDER — SODIUM CHLORIDE 0.9% FLUSH: 3.0000 mL | Freq: Once | INTRAVENOUS | Status: DC

## 2019-11-22 MED ORDER — RIVAROXABAN (XARELTO) VTE STARTER PACK (15 & 20 MG)
ORAL_TABLET | ORAL | 0 refills | Status: AC
Start: 2019-11-22 — End: ?

## 2019-11-22 NOTE — Discharge Instructions (Signed)
Follow-up with a primary care doctor in the office later next week for reassessment. Start taking blood thinning medication as prescribed.  If you have any significant bleeding, head injury, significant shortness of breath, passout or new concerns come the emergency room or call EMS. Do not take ibuprofen/Advil/Motrin medications while on this blood thinner. If you decide to try to get pregnant please discuss this immediately with your doctor as you will have to have a change in medication.  You will need a repeat ultrasound of your leg in the coming weeks.

## 2019-11-22 NOTE — ED Notes (Signed)
Patient verbalizes understanding of discharge instructions. Opportunity for questioning and answers were provided. Armband removed by staff, pt discharged from ED to home 

## 2019-11-22 NOTE — ED Triage Notes (Signed)
C/o SOB, dizziness, and nausea that started today.  Also reports "purple spots" on L upper leg x 1 week.  States she had 2 episodes of sharp chest pain today and 1 episode yesterday.

## 2019-11-22 NOTE — Progress Notes (Signed)
VASCULAR LAB PRELIMINARY  PRELIMINARY  PRELIMINARY  PRELIMINARY  Left lower extremity venous duplex completed.    Preliminary report:  See CV proc for preliminary results.  Gave report to Dr. Rockey Situ, [facility], RVT 11/22/2019, 8:06 PM

## 2019-11-22 NOTE — ED Provider Notes (Signed)
[facility] DEPART[facility] Provider Note   CSN: [id number] Arrival date & time: 11/22/19  1536     History Chief Complaint  Patient presents with  . Dizziness  . Shortness of Breath    Amber Luna is a 34 y.o. female.  Patient with history of psoriasis, ADHD presents with mild shortness of breath with exertion intermittent, lightheadedness and nausea for the past few days. Patient also noticed small rash left upper leg anterior and medial knee for the past week. No insect bites recalled. No fevers or chills. No leg swelling. No history of blood clots or risk factors for blood clots. Currently no symptoms. Patient had brief sharp chest pain left lateral earlier today. No cardiac or blood clot history.        Past Medical History:  Diagnosis Date  . Fatigue 07/08/2019    Patient Active Problem List   Diagnosis Date Noted  . Fatigue 07/08/2019  . Rash and nonspecific skin eruption 07/08/2019  . Nocturia 07/08/2019  . Night sweat 07/08/2019  . Sensitivity to the cold 07/08/2019  . Peripheral vasoconstriction ([place]) 07/08/2019  . History of thyroid nodule 07/08/2019  . Muscle weakness (generalized) 07/08/2019  . Attention deficit hyperactivity disorder (ADHD) 07/08/2019  . Irregular heart rhythm 07/08/2019  . Psoriasis 10/17/2016  . Irritable bowel syndrome (IBS) 08/18/2015    History reviewed. No pertinent surgical history.   OB History   No obstetric history on file.     No family history on file.  Social History   Tobacco Use  . Smoking status: Never Smoker  . Smokeless tobacco: Never Used  Substance Use Topics  . Alcohol use: Yes    Alcohol/week: 2.0 standard drinks    Types: 1 Glasses of wine, 1 Shots of liquor per week  . Drug use: Never    Home Medications Prior to Admission medications   Medication Sig Start Date End Date Taking? Authorizing Provider  amphetamine-dextroamphetamine (ADDERALL) 20 MG tablet Take 60 mg by  mouth daily.    [provider]  Multiple Vitamin (MULTIVITAMIN) tablet Take 1 tablet by mouth daily.    [provider]    Allergies    Patient has no known allergies.  Review of Systems   Review of Systems  Constitutional: Negative for chills and fever.  HENT: Negative for congestion.   Eyes: Negative for visual disturbance.  Respiratory: Positive for shortness of breath.   Cardiovascular: Negative for chest pain.  Gastrointestinal: Negative for abdominal pain and vomiting.  Genitourinary: Negative for dysuria and flank pain.  Musculoskeletal: Negative for back pain, neck pain and neck stiffness.  Skin: Negative for rash.  Neurological: Positive for light-headedness. Negative for syncope and headaches.    Physical Exam Updated Vital Signs BP 135/87 (BP Location: Right Arm)   Pulse 85   Temp 98.4 F (36.9 C) (Oral)   Resp 14   Ht 5\' 4"  (1.626 m)   Wt 65.8 kg   LMP 11/06/2019   SpO2 100%   BMI 24.89 kg/m   Physical Exam Vitals and nursing note reviewed.  Constitutional:      Appearance: She is well-developed.  HENT:     Head: Normocephalic and atraumatic.  Eyes:     General:        Right eye: No discharge.        Left eye: No discharge.     Conjunctiva/sclera: Conjunctivae normal.  Neck:     Trachea: No tracheal deviation.  Cardiovascular:  Rate and Rhythm: Normal rate and regular rhythm.  Pulmonary:     Effort: Pulmonary effort is normal.     Breath sounds: Normal breath sounds.  Abdominal:     General: There is no distension.     Palpations: Abdomen is soft.     Tenderness: There is no abdominal tenderness. There is no guarding.  Musculoskeletal:     Cervical back: Normal range of motion and neck supple.     Right lower leg: No tenderness. No edema.     Left lower leg: No tenderness. No edema.  Skin:    General: Skin is warm.     Capillary Refill: Capillary refill takes less than 2 seconds.     Findings: No rash.     Comments:  Patient has minimal erythema anterior distal thigh on the left, no significant tenderness, no sign of infection. Neurovascularly intact left lower extremity.  Neurological:     General: No focal deficit present.     Mental Status: She is alert and oriented to person, place, and time.     ED Results / Procedures / Treatments   Labs (all labs ordered are listed, but only abnormal results are displayed) Labs Reviewed  CBC - Abnormal; Notable for the following components:      Result Value   Platelets 444 (*)    All other components within normal limits  BASIC METABOLIC PANEL  D-DIMER, QUANTITATIVE (NOT AT [facility])  I-STAT BETA HCG BLOOD, ED ([facility], [facility], [facility] ONLY)  TROPONIN I (HIGH SENSITIVITY)  TROPONIN I (HIGH SENSITIVITY)    EKG None  Radiology DG Chest 2 View  Result Date: 11/22/2019 CLINICAL DATA:  Chest pain, dizziness, shortness of breath EXAM: CHEST - 2 VIEW COMPARISON:  04/20/2018 FINDINGS: Normal heart size, mediastinal contours, and pulmonary vascularity. Lungs clear. No pleural effusion or pneumothorax. Bones unremarkable. IMPRESSION: Normal exam. Electronically Signed   By: Ulyses Southward M.D.   On: 11/22/2019 16:58    Procedures Procedures (including critical care time)  Medications Ordered in ED Medications  sodium chloride flush (NS) 0.9 % injection 3 mL (has no administration in time range)    ED Course  I have reviewed the triage vital signs and the nursing notes.  Pertinent labs & imaging results that were available during my care of the patient were reviewed by me and considered in my medical decision making (see chart for details).    MDM Rules/Calculators/A&P                      Patient with no significant medical history presents with mild shortness of breath and intermittent lightheadedness. Patient is very well-appearing on exam, low risk for cardiac or blood clot etiology. Troponin negative, EKG no acute findings. Chest x-ray reviewed no acute abnormalities.  Patient low risk for blood clots, D-dimer sent and if technician available ultrasound left lower extremity. Patient comfortable this plan.  Discussed with ultrasound technician showing small DVT left lower extremity.  D-dimer negative. Final Clinical Impression(s) / ED Diagnoses Final diagnoses:  Shortness of breath    Rx / DC Orders ED Discharge Orders    None       Blane Ohara, MD 11/23/19 1001

## 2019-12-02 ENCOUNTER — Encounter: Payer: Self-pay | Admitting: Registered Nurse

## 2019-12-02 ENCOUNTER — Ambulatory Visit: Payer: 59 | Admitting: Registered Nurse

## 2019-12-02 ENCOUNTER — Other Ambulatory Visit: Payer: Self-pay

## 2019-12-02 VITALS — BP 109/75 | HR 76 | Temp 98.0°F | Resp 15 | Ht 64.0 in | Wt 154.4 lb

## 2019-12-02 DIAGNOSIS — I824Y9 Acute embolism and thrombosis of unspecified deep veins of unspecified proximal lower extremity: Secondary | ICD-10-CM | POA: Diagnosis not present

## 2019-12-02 NOTE — Patient Instructions (Signed)
° ° ° °  If you have lab work done today you will be contacted with your lab results within the next 2 weeks.  If you have not heard from us then please contact us. The fastest way to get your results is to register for My Chart. ° ° °IF you received an x-ray today, you will receive an invoice from [facility]. Please contact  Radiology at [phone] with questions or concerns regarding your invoice.  ° °IF you received labwork today, you will receive an invoice from [facility]. Please contact [facility] at [phone] with questions or concerns regarding your invoice.  ° °Our billing staff will not be able to assist you with questions regarding bills from these companies. ° °You will be contacted with the lab results as soon as they are available. The fastest way to get your results is to activate your My Chart account. Instructions are located on the last page of this paperwork. If you have not heard from us regarding the results in 2 weeks, please contact this office. °  ° ° ° °

## 2019-12-03 LAB — CBC WITH DIFFERENTIAL/PLATELET
Basophils Absolute: 0 10*3/uL (ref 0.0–0.2)
Basos: 0 %
EOS (ABSOLUTE): 0.1 10*3/uL (ref 0.0–0.4)
Eos: 1 %
Hematocrit: 42.2 % (ref 34.0–46.6)
Hemoglobin: 14.1 g/dL (ref 11.1–15.9)
Immature Grans (Abs): 0 10*3/uL (ref 0.0–0.1)
Immature Granulocytes: 0 %
Lymphocytes Absolute: 2.4 10*3/uL (ref 0.7–3.1)
Lymphs: 36 %
MCH: 31.7 pg (ref 26.6–33.0)
MCHC: 33.4 g/dL (ref 31.5–35.7)
MCV: 95 fL (ref 79–97)
Monocytes Absolute: 0.6 10*3/uL (ref 0.1–0.9)
Monocytes: 8 %
Neutrophils Absolute: 3.7 10*3/uL (ref 1.4–7.0)
Neutrophils: 55 %
Platelets: 390 10*3/uL (ref 150–450)
RBC: 4.45 x10E6/uL (ref 3.77–5.28)
RDW: 12.4 % (ref 11.7–15.4)
WBC: 6.8 10*3/uL (ref 3.4–10.8)

## 2019-12-03 LAB — SEDIMENTATION RATE: Sed Rate: 7 mm/hr (ref 0–32)

## 2019-12-03 LAB — ANA: Anti Nuclear Antibody (ANA): NEGATIVE

## 2019-12-03 LAB — HCG, SERUM, QUALITATIVE: hCG,Beta Subunit,Qual,Serum: POSITIVE m[IU]/mL — AB (ref <6)

## 2019-12-15 NOTE — Progress Notes (Signed)
If we could call pt - I need to speak with her regarding her lab results  Thank you  Jari Sportsman, NP

## 2019-12-29 ENCOUNTER — Encounter: Payer: Self-pay | Admitting: Registered Nurse

## 2020-03-09 ENCOUNTER — Encounter: Payer: Self-pay | Admitting: Registered Nurse

## 2020-03-09 NOTE — Progress Notes (Signed)
Established Patient Office Visit  Subjective:  Patient ID: Amber Luna, female    DOB: 05/19/1986  Age: 34 y.o. MRN: [id number]  CC:  Chief Complaint  Patient presents with  . Hospitalization Follow-up    pt had a blood clot on saturday and was admitted for treatment they placed her on blood thinners at the time and told her to follow up with [place] to have a referral to Vascular placed. has not started blood thinners yet due to bactrum pt does have family history her father and his mother both had diabetes and blood clots    HPI Amber Luna presents for hospital follow up  Presented with lower extremity edema. Found to have dvt. Started on thinners.  Referral to vascular needs to be placed for follow up No hx of clot or provoking factors.  Denies symptoms of CVA or PE.  No CV symptoms. Feeling well overall   Past Medical History:  Diagnosis Date  . Fatigue 07/08/2019    No past surgical history on file.  Family History  Problem Relation Age of Onset  . Diabetes Father   . Clotting disorder Father   . Diabetes Paternal Grandmother   . Clotting disorder Paternal Grandmother     Social History   Socioeconomic History  . Marital status: Married    Spouse name: Not on file  . Number of children: Not on file  . Years of education: Not on file  . Highest education level: Not on file  Occupational History  . Not on file  Tobacco Use  . Smoking status: Never Smoker  . Smokeless tobacco: Never Used  Vaping Use  . Vaping Use: Never used  Substance and Sexual Activity  . Alcohol use: Yes    Alcohol/week: 2.0 standard drinks    Types: 1 Glasses of wine, 1 Shots of liquor per week  . Drug use: Never  . Sexual activity: Yes  Other Topics Concern  . Not on file  Social History Narrative  . Not on file   Social Determinants of Health   Financial Resource Strain:   . Difficulty of Paying Living Expenses: Not on file  Food Insecurity:   . Worried About Community education officer in the Last Year: Not on file  . Ran Out of Food in the Last Year: Not on file  Transportation Needs:   . Lack of Transportation (Medical): Not on file  . Lack of Transportation (Non-Medical): Not on file  Physical Activity:   . Days of Exercise per Week: Not on file  . Minutes of Exercise per Session: Not on file  Stress:   . Feeling of Stress : Not on file  Social Connections:   . Frequency of Communication with Friends and Family: Not on file  . Frequency of Social Gatherings with Friends and Family: Not on file  . Attends Religious Services: Not on file  . Active Member of Clubs or Organizations: Not on file  . Attends Banker Meetings: Not on file  . Marital Status: Not on file  Intimate Partner Violence:   . Fear of Current or Ex-Partner: Not on file  . Emotionally Abused: Not on file  . Physically Abused: Not on file  . Sexually Abused: Not on file    Outpatient Medications Prior to Visit  Medication Sig Dispense Refill  . FLUoxetine (PROZAC) 20 MG tablet Take 20 mg by mouth daily.    Marland Kitchen LORazepam (ATIVAN) 0.5 MG tablet Take 0.5 mg  by mouth every 8 (eight) hours.    Marland Kitchen amphetamine-dextroamphetamine (ADDERALL) 20 MG tablet Take 60 mg by mouth daily.    . Multiple Vitamin (MULTIVITAMIN) tablet Take 1 tablet by mouth daily.    Marland Kitchen RIVAROXABAN (XARELTO) VTE STARTER PACK (15 & 20 MG TABLETS) Follow package directions: Take one 15mg  tablet by mouth twice a day. On day 22, switch to one 20mg  tablet once a day. Take with food. 51 each 0   No facility-administered medications prior to visit.    No Known Allergies  ROS Review of Systems  Constitutional: Negative.   HENT: Negative.   Eyes: Negative.   Respiratory: Negative.   Cardiovascular: Negative.   Gastrointestinal: Negative.   Genitourinary: Negative.   Musculoskeletal: Negative.   Skin: Negative.   Neurological: Negative.   Psychiatric/Behavioral: Negative.       Objective:    Physical  Exam Vitals and nursing note reviewed.  Constitutional:      General: She is not in acute distress.    Appearance: Normal appearance. She is normal weight. She is not ill-appearing, toxic-appearing or diaphoretic.  Cardiovascular:     Rate and Rhythm: Normal rate and regular rhythm.     Heart sounds: Normal heart sounds. No murmur heard.  No friction rub. No gallop.   Pulmonary:     Effort: Pulmonary effort is normal. No respiratory distress.     Breath sounds: Normal breath sounds. No stridor. No wheezing, rhonchi or rales.  Chest:     Chest wall: No tenderness.  Musculoskeletal:     Right lower leg: No edema.     Left lower leg: Edema (dvt) present.  Skin:    General: Skin is warm and dry.  Neurological:     General: No focal deficit present.     Mental Status: She is alert and oriented to person, place, and time. Mental status is at baseline.  Psychiatric:        Mood and Affect: Mood normal.        Behavior: Behavior normal.        Thought Content: Thought content normal.        Judgment: Judgment normal.     BP 109/75   Pulse 76   Temp 98 F (36.7 C) (Temporal)   Resp 15   Ht 5\' 4"  (1.626 m)   Wt 154 lb 6.4 oz (70 kg)   LMP 11/06/2019   SpO2 98%   BMI 26.50 kg/m  Wt Readings from Last 3 Encounters:  12/02/19 154 lb 6.4 oz (70 kg)  11/22/19 145 lb (65.8 kg)  07/08/19 148 lb 3.2 oz (67.2 kg)     Health Maintenance Due  Topic Date Due  . Hepatitis C Screening  Never done  . HIV Screening  Never done  . INFLUENZA VACCINE  01/18/2020    There are no preventive care reminders to display for this patient.  Lab Results  Component Value Date   TSH 1.000 07/08/2019   Lab Results  Component Value Date   WBC 6.8 12/02/2019   HGB 14.1 12/02/2019   HCT 42.2 12/02/2019   MCV 95 12/02/2019   PLT 390 12/02/2019   Lab Results  Component Value Date   NA 140 11/22/2019   K 3.6 11/22/2019   CO2 28 11/22/2019   GLUCOSE 78 11/22/2019   BUN 6 11/22/2019    CREATININE 0.72 11/22/2019   BILITOT 0.5 07/19/2018   ALKPHOS 30 (L) 07/19/2018   AST 17 07/19/2018  ALT 16 07/19/2018   PROT 7.2 07/19/2018   ALBUMIN 4.1 07/19/2018   CALCIUM 9.2 11/22/2019   ANIONGAP 6 11/22/2019   No results found for: CHOL No results found for: HDL No results found for: LDLCALC No results found for: TRIG No results found for: CHOLHDL No results found for: FYBO1B    Assessment & Plan:   Problem List Items Addressed This Visit    None    Visit Diagnoses    Acute deep vein thrombosis (DVT) of proximal vein of lower extremity, unspecified laterality (HCC)    -  Primary   Relevant Orders   Ambulatory referral to Vascular Surgery   Platelet count   CBC with Differential (Completed)   ANA (Completed)   Sedimentation Rate (Completed)   hCG, serum, qualitative (Completed)      No orders of the defined types were placed in this encounter.   Follow-up: No follow-ups on file.   PLAN  Will refer to vascular  Continue thinners  Labs collected to investigate for provoking factors, will follow up as warranted  Patient encouraged to call clinic with any questions, comments, or concerns.  Janeece Agee, NP

## 2020-09-22 ENCOUNTER — Other Ambulatory Visit: Payer: Self-pay | Admitting: Endodontics

## 2020-09-22 ENCOUNTER — Other Ambulatory Visit: Payer: Self-pay | Admitting: Obstetrics and Gynecology

## 2020-09-22 DIAGNOSIS — N63 Unspecified lump in unspecified breast: Secondary | ICD-10-CM

## 2020-10-19 ENCOUNTER — Other Ambulatory Visit: Payer: Self-pay

## 2020-10-19 ENCOUNTER — Ambulatory Visit
Admission: RE | Admit: 2020-10-19 | Discharge: 2020-10-19 | Disposition: A | Discharge status: Home / Self Care | Payer: 59 | Source: Ambulatory Visit | Attending: Obstetrics and Gynecology | Admitting: Obstetrics and Gynecology

## 2020-10-19 DIAGNOSIS — N63 Unspecified lump in unspecified breast: Secondary | ICD-10-CM

## 2021-08-03 IMAGING — CR DG CHEST 2V
2 series · 2 of 2 positions shown · non-contrast
Comparison: 04/20/2018

CLINICAL DATA: Chest pain, dizziness, shortness of breath

EXAM:
CHEST - 2 VIEW

[chest pa]
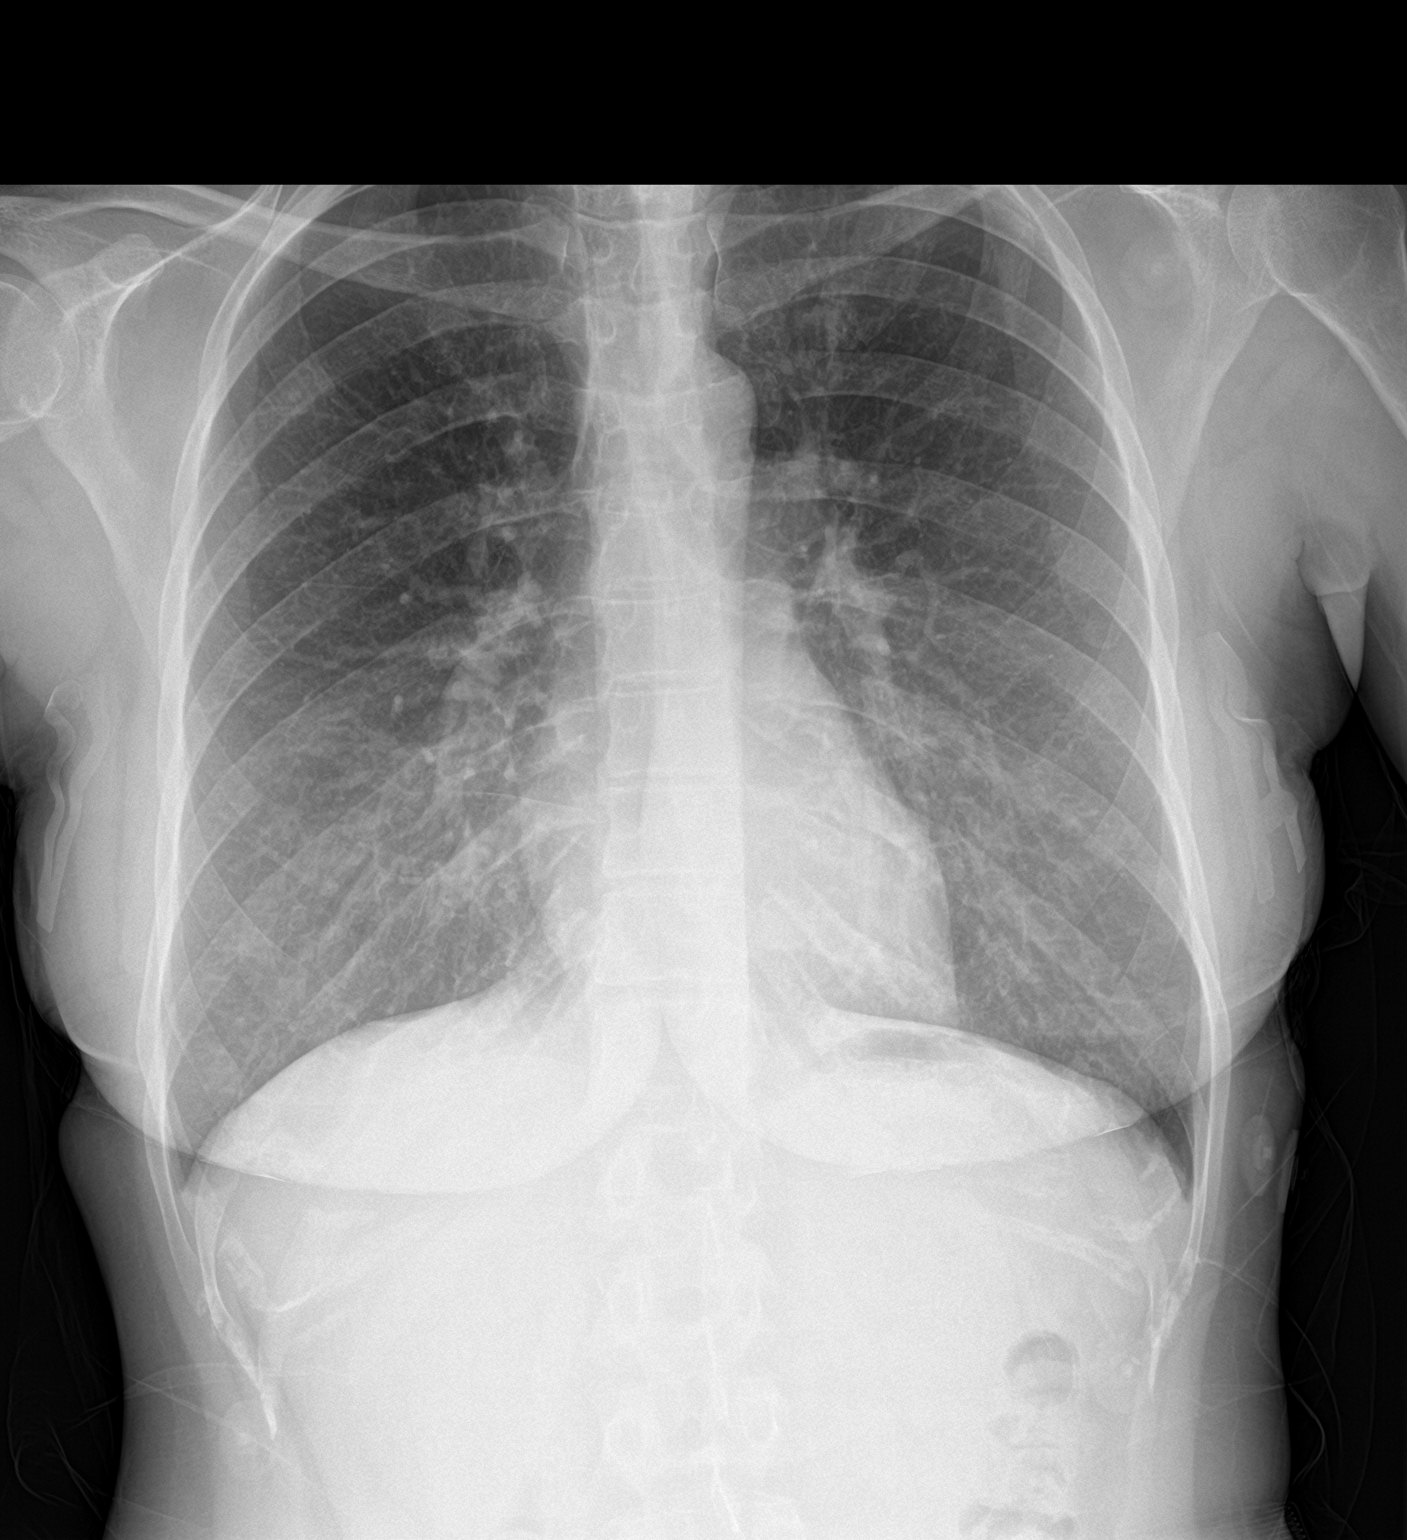

[chest lat]
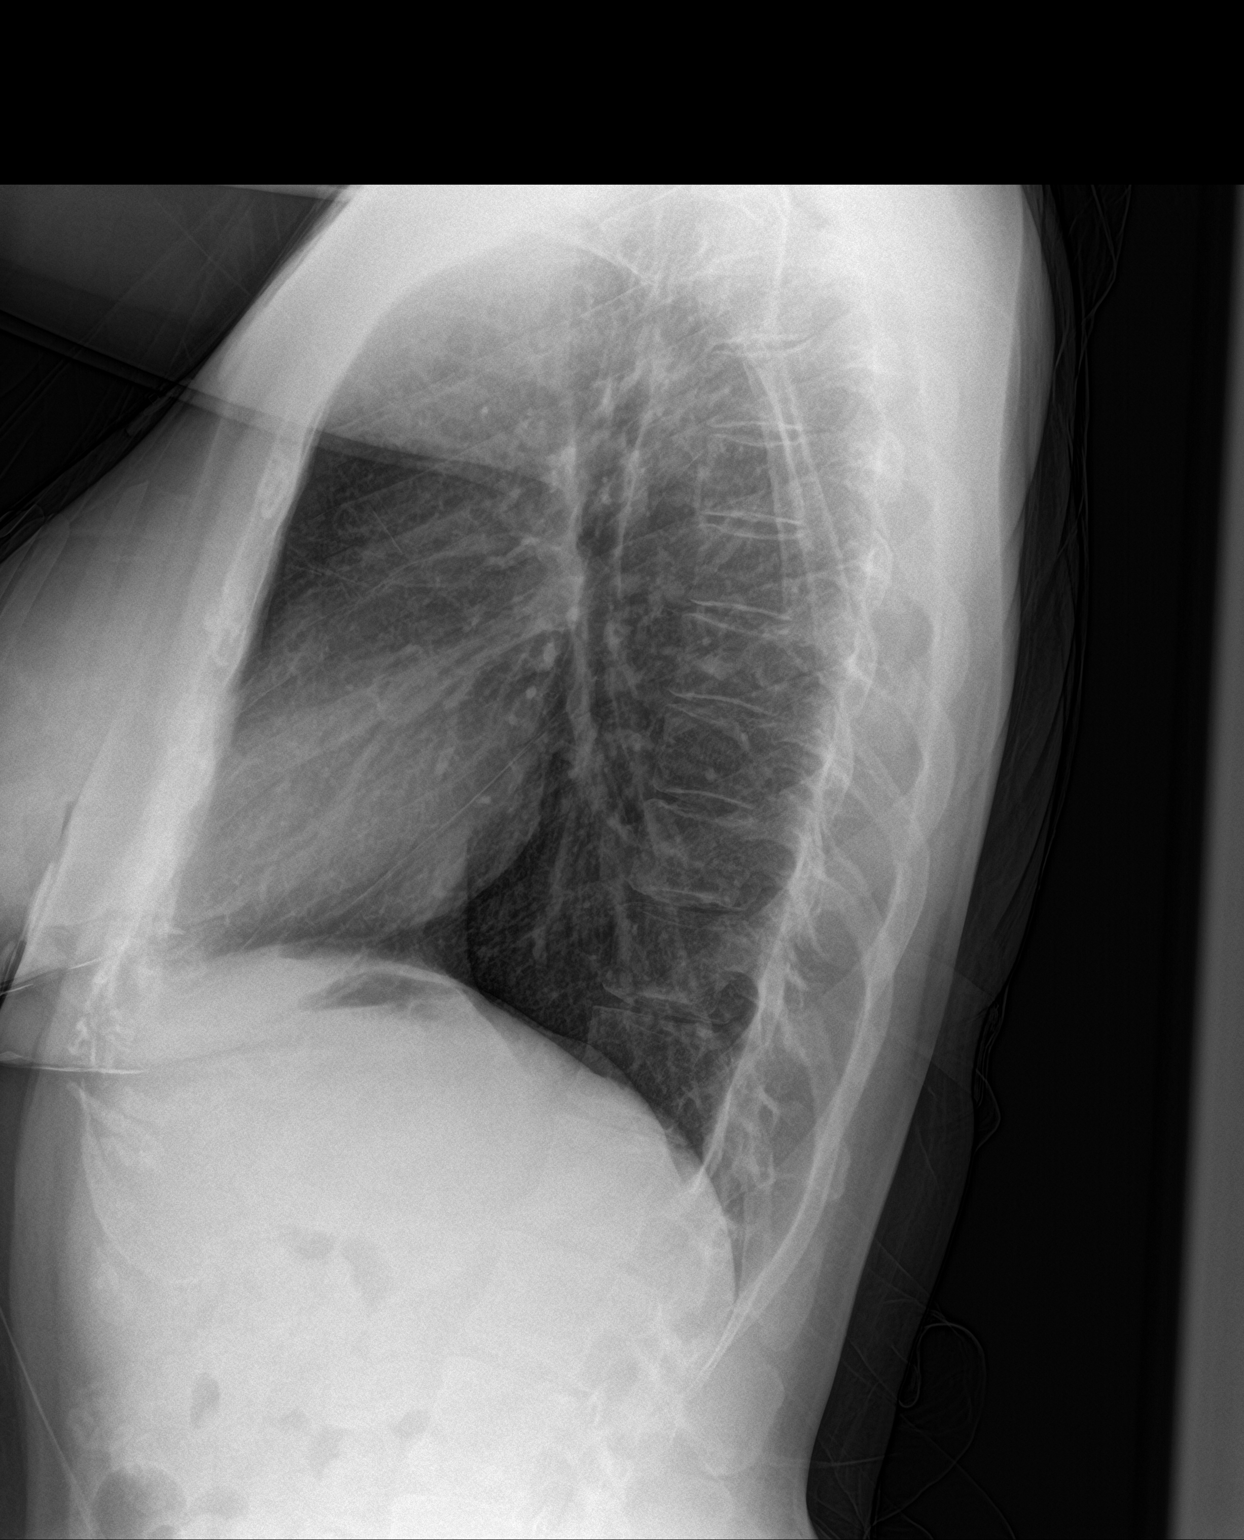

[2 of 2 positions shown; findings below may reference images not displayed]

FINDINGS: Normal heart size, mediastinal contours, and pulmonary vascularity.

Lungs clear.

No pleural effusion or pneumothorax.

Bones unremarkable.
IMPRESSION: Normal exam.

## 2021-09-05 ENCOUNTER — Ambulatory Visit: Payer: 59 | Admitting: Family Medicine

## 2022-01-18 ENCOUNTER — Ambulatory Visit
Admission: RE | Admit: 2022-01-18 | Discharge: 2022-01-18 | Disposition: A | Discharge status: Home / Self Care | Payer: 59 | Source: Ambulatory Visit | Attending: Internal Medicine | Admitting: Internal Medicine

## 2022-01-18 ENCOUNTER — Other Ambulatory Visit: Payer: Self-pay | Admitting: Internal Medicine

## 2022-01-18 DIAGNOSIS — E041 Nontoxic single thyroid nodule: Secondary | ICD-10-CM

## 2022-01-20 ENCOUNTER — Other Ambulatory Visit: Payer: Self-pay | Admitting: Internal Medicine

## 2022-01-20 DIAGNOSIS — E042 Nontoxic multinodular goiter: Secondary | ICD-10-CM

## 2022-02-15 ENCOUNTER — Other Ambulatory Visit (HOSPITAL_COMMUNITY)
Admission: RE | Admit: 2022-02-15 | Discharge: 2022-02-15 | Disposition: A | Discharge status: Home / Self Care | Payer: 59 | Source: Ambulatory Visit | Attending: Internal Medicine | Admitting: Internal Medicine

## 2022-02-15 ENCOUNTER — Ambulatory Visit
Admission: RE | Admit: 2022-02-15 | Discharge: 2022-02-15 | Disposition: A | Discharge status: Home / Self Care | Payer: 59 | Source: Ambulatory Visit | Attending: Internal Medicine | Admitting: Internal Medicine

## 2022-02-15 DIAGNOSIS — E041 Nontoxic single thyroid nodule: Secondary | ICD-10-CM | POA: Insufficient documentation

## 2022-02-15 DIAGNOSIS — E042 Nontoxic multinodular goiter: Secondary | ICD-10-CM

## 2022-02-17 LAB — CYTOLOGY - NON PAP

## 2022-07-01 IMAGING — US US BREAST*R* LIMITED INC AXILLA
1 series · 2 of 2 positions shown · non-contrast
Comparison: None.
COMPARISON: None.

Addendum:
:
CLINICAL DATA: Lump noted in the UPPER-OUTER QUADRANT of the RIGHT

breast after sharp pain. Symptoms for 3 months.
EXAM:
DIGITAL DIAGNOSTIC BILATERAL MAMMOGRAM WITH TOMOSYNTHESIS AND CAD
TECHNIQUE: Bilateral digital diagnostic mammography and breast tomosynthesis
was performed. The images were evaluated with computer-aided
detection.

[Series 1: us breast*right* limited inc axilla · 0.07mm/px · 2 of 2 slices shown]
[im 1/2]
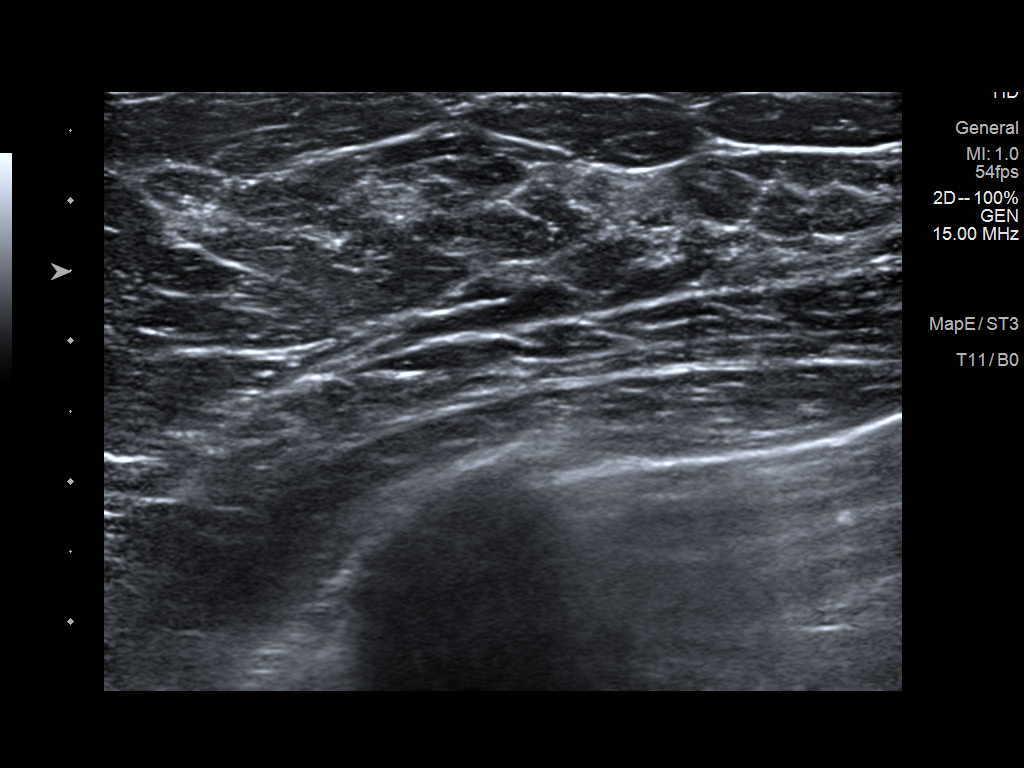
[im 2/2]
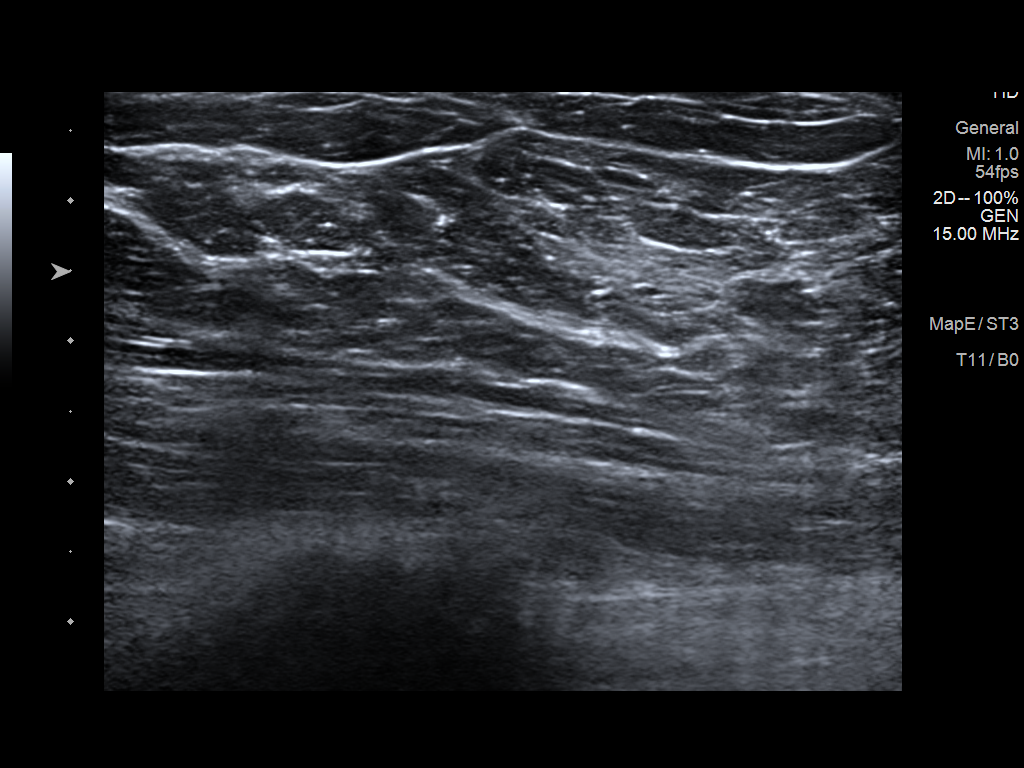

[2 of 2 positions shown; findings below may reference images not displayed]

ACR Breast Density Category b: There are scattered areas of

fibroglandular density.
FINDINGS: No suspicious mass, distortion, or microcalcifications are

identified to suggest presence of malignancy. Asymmetric

fibroglandular tissue is identified in the UPPER-OUTER QUADRANT of

the RIGHT breast, without mass effect or distortion. Spot tangential

view is performed in the area of patient's concern in the UPPER

central RIGHT breast. This view shows normal appearing

fibroglandular tissue.

On physical exam, I palpate soft non focal thickening in the 11-12

o'clock location of the RIGHT breast.

Targeted ultrasound is performed, showing normal appearing

fibroglandular tissue in the UPPER central portion of the RIGHT

breast. Normal appearing fibroglandular tissue is identified in the

10-11 o'clock location of the RIGHT breast are spine mammographic

asymmetry.
IMPRESSION: No mammographic or ultrasound evidence for malignancy.

RECOMMENDATION:

Screening mammogram at age 41 unless there are persistent or

intervening clinical concerns. (Code:03-Y-59A)

I have discussed the findings and recommendations with the patient.

If applicable, a reminder letter will be sent to the patient

regarding the next appointment.

BI-RADS CATEGORY  1: Negative.

ADDENDUM:
Ultrasound is performed of the right breast.

*** End of Addendum ***
:
ACR Breast Density Category b: There are scattered areas of

fibroglandular density.
FINDINGS: No suspicious mass, distortion, or microcalcifications are

identified to suggest presence of malignancy. Asymmetric

fibroglandular tissue is identified in the UPPER-OUTER QUADRANT of

the RIGHT breast, without mass effect or distortion. Spot tangential

view is performed in the area of patient's concern in the UPPER

central RIGHT breast. This view shows normal appearing

fibroglandular tissue.

On physical exam, I palpate soft non focal thickening in the 11-12

o'clock location of the RIGHT breast.

Targeted ultrasound is performed, showing normal appearing

fibroglandular tissue in the UPPER central portion of the RIGHT

breast. Normal appearing fibroglandular tissue is identified in the

10-11 o'clock location of the RIGHT breast are spine mammographic

asymmetry.
IMPRESSION: No mammographic or ultrasound evidence for malignancy.

RECOMMENDATION:

Screening mammogram at age 41 unless there are persistent or

intervening clinical concerns. (Code:03-Y-59A)

I have discussed the findings and recommendations with the patient.

If applicable, a reminder letter will be sent to the patient

regarding the next appointment.

BI-RADS CATEGORY  1: Negative.

## 2022-07-01 IMAGING — MG DIGITAL DIAGNOSTIC BILAT W/ TOMO W/ CAD
6 of 10 series · 6 of 30 positions shown · non-contrast
Comparison: None.
COMPARISON: None.

Addendum:
CLINICAL DATA: Lump noted in the UPPER-OUTER QUADRANT of the RIGHT
breast after sharp pain. Symptoms for 3 months.

EXAM:
DIGITAL DIAGNOSTIC BILATERAL MAMMOGRAM WITH TOMOSYNTHESIS AND CAD
TECHNIQUE: Bilateral digital diagnostic mammography and breast tomosynthesis
was performed. The images were evaluated with computer-aided
detection.

[L MLO synth-2D]
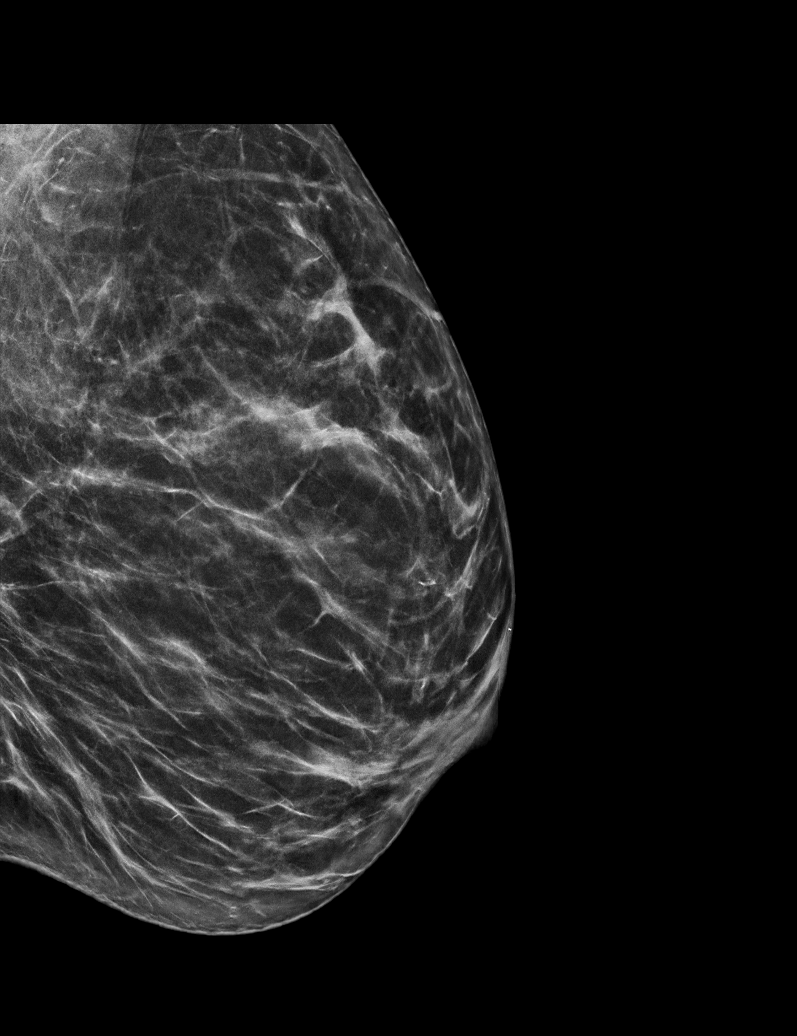

[R MLO synth-2D]
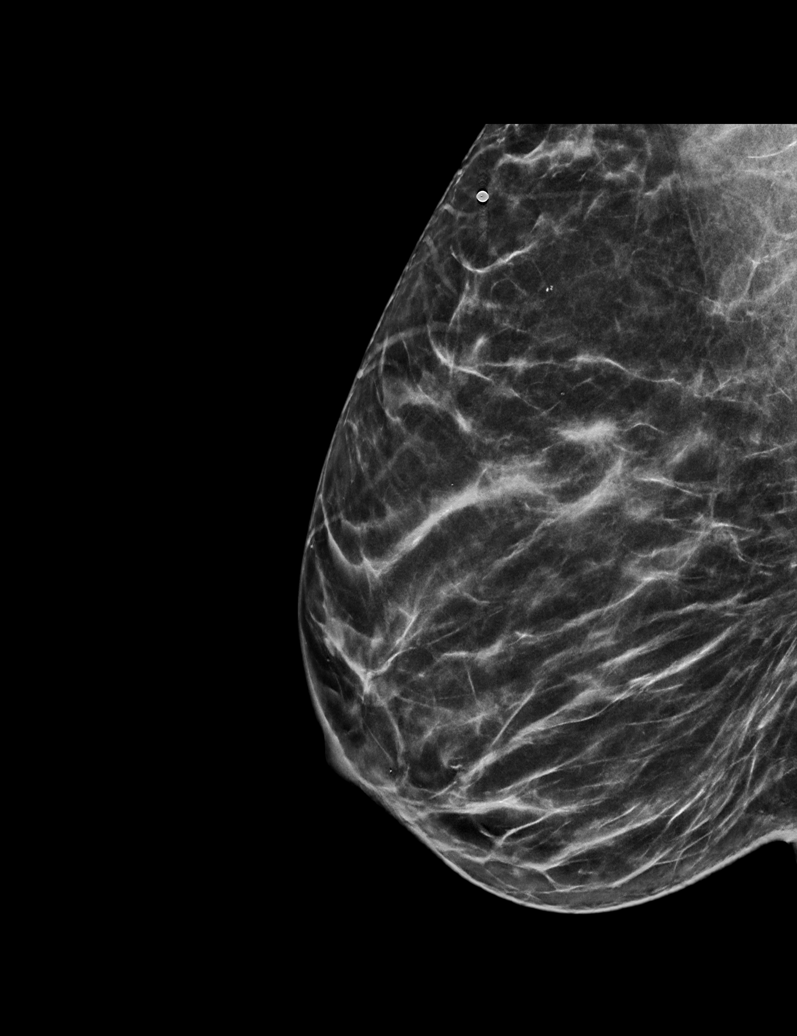

[R CC synth-2D]
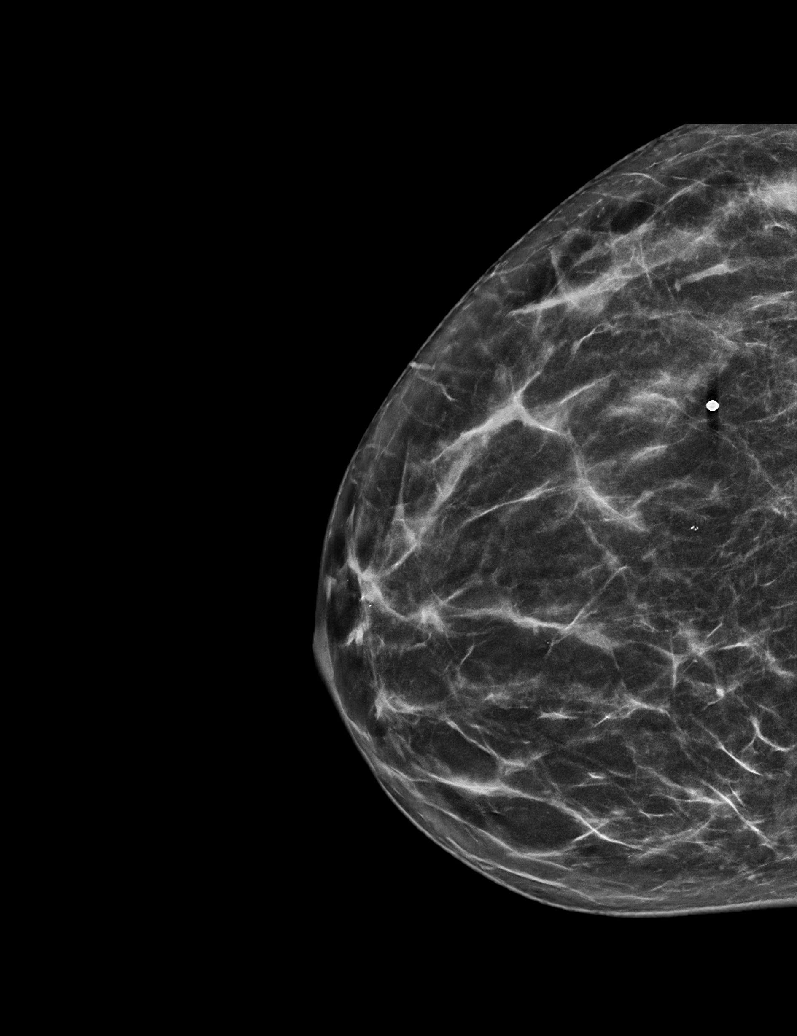

[L CC synth-2D]
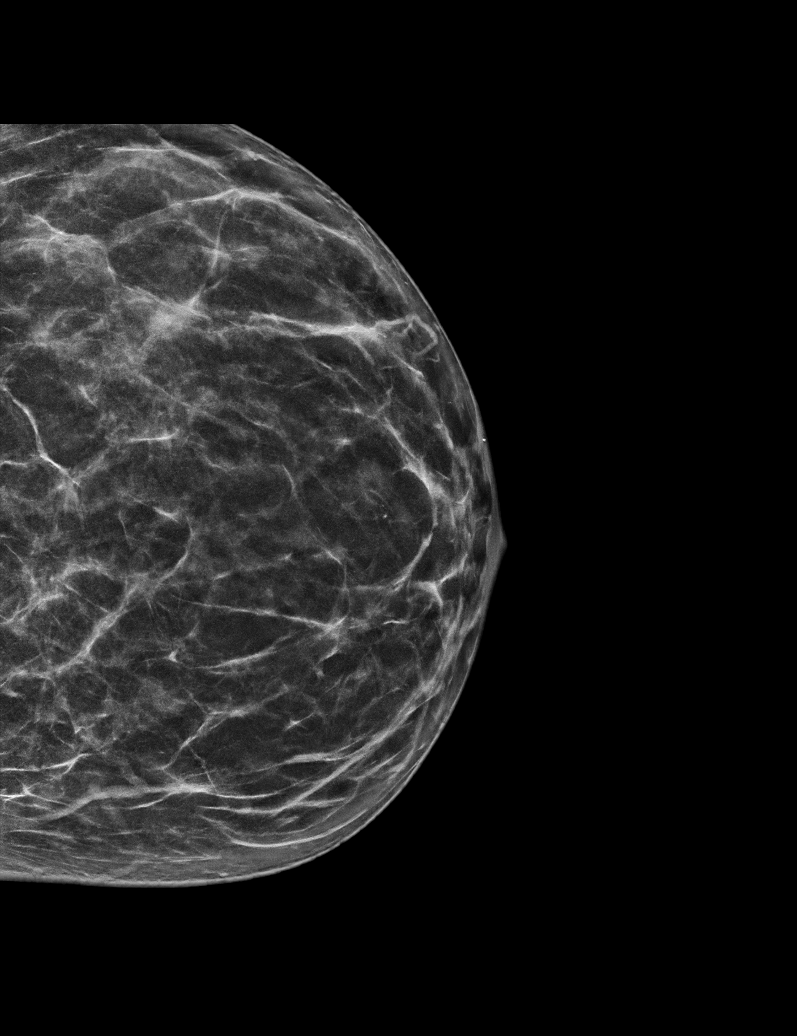

[R TAN synth-2D]
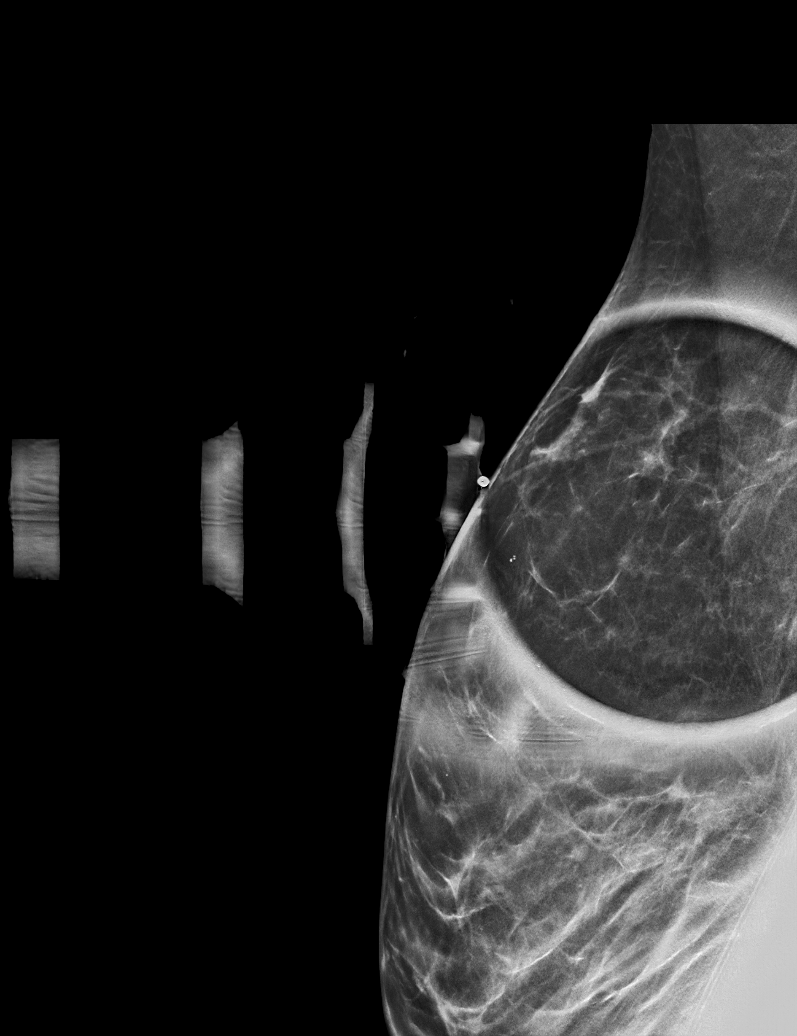

[R TAN tomo · tomo slice 29/57.0]
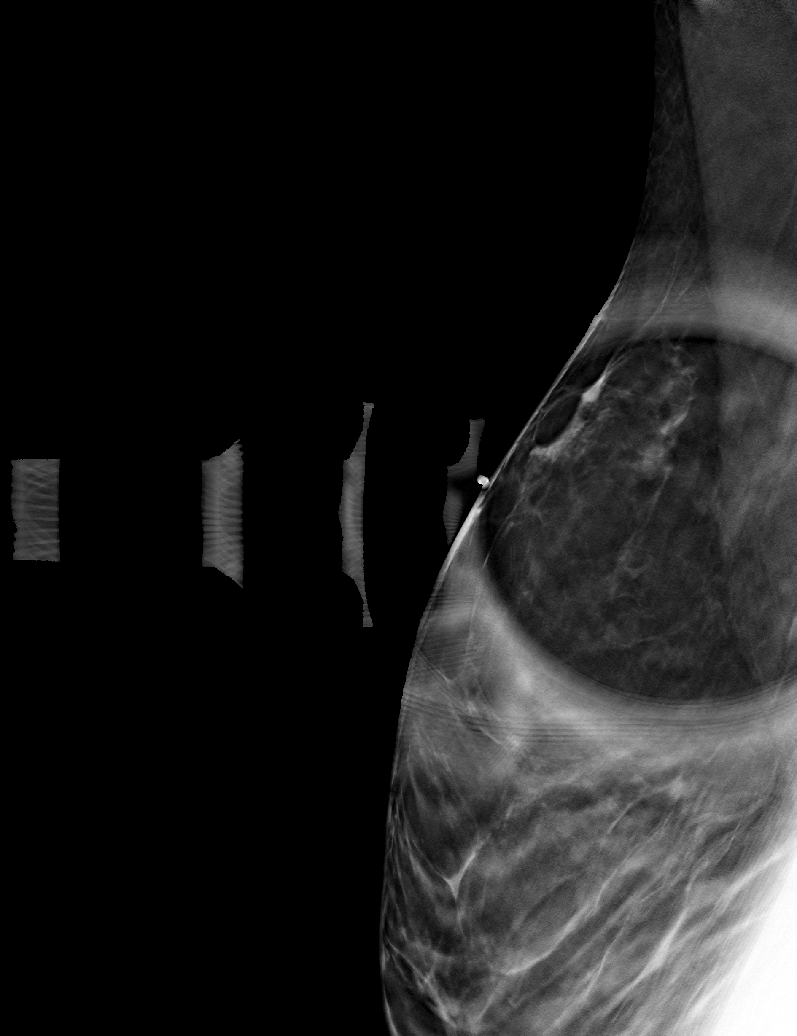

[6 of 30 positions shown; findings below may reference images not displayed]

ACR Breast Density Category b: There are scattered areas of
fibroglandular density.
FINDINGS: No suspicious mass, distortion, or microcalcifications are
identified to suggest presence of malignancy. Asymmetric
fibroglandular tissue is identified in the UPPER-OUTER QUADRANT of
the RIGHT breast, without mass effect or distortion. Spot tangential
view is performed in the area of patient's concern in the UPPER
central RIGHT breast. This view shows normal appearing
fibroglandular tissue.

On physical exam, I palpate soft non focal thickening in the 11-12
o'clock location of the RIGHT breast.

Targeted ultrasound is performed, showing normal appearing
fibroglandular tissue in the UPPER central portion of the RIGHT
breast. Normal appearing fibroglandular tissue is identified in the
10-11 o'clock location of the RIGHT breast are spine mammographic
asymmetry.
IMPRESSION: No mammographic or ultrasound evidence for malignancy.

RECOMMENDATION:
Screening mammogram at age 41 unless there are persistent or
intervening clinical concerns. (Code:4F-[id number])

I have discussed the findings and recommendations with the patient.
If applicable, a reminder letter will be sent to the patient
regarding the next appointment.

BI-RADS CATEGORY  1: Negative.

ADDENDUM:
Ultrasound is performed of the right breast.

*** End of Addendum ***
ACR Breast Density Category b: There are scattered areas of
fibroglandular density.
FINDINGS: No suspicious mass, distortion, or microcalcifications are
identified to suggest presence of malignancy. Asymmetric
fibroglandular tissue is identified in the UPPER-OUTER QUADRANT of
the RIGHT breast, without mass effect or distortion. Spot tangential
view is performed in the area of patient's concern in the UPPER
central RIGHT breast. This view shows normal appearing
fibroglandular tissue.

On physical exam, I palpate soft non focal thickening in the 11-12
o'clock location of the RIGHT breast.

Targeted ultrasound is performed, showing normal appearing
fibroglandular tissue in the UPPER central portion of the RIGHT
breast. Normal appearing fibroglandular tissue is identified in the
10-11 o'clock location of the RIGHT breast are spine mammographic
asymmetry.
IMPRESSION: No mammographic or ultrasound evidence for malignancy.

RECOMMENDATION:
Screening mammogram at age 41 unless there are persistent or
intervening clinical concerns. (Code:[id number])

I have discussed the findings and recommendations with the patient.
If applicable, a reminder letter will be sent to the patient
regarding the next appointment.

BI-RADS CATEGORY  1: Negative.

## 2023-08-05 ENCOUNTER — Emergency Department (HOSPITAL_COMMUNITY)
Admission: EM | Admit: 2023-08-05 | Discharge: 2023-08-05 | Discharge status: Against Medical Advice | Payer: Self-pay | Source: Home / Self Care

## 2023-08-05 ENCOUNTER — Other Ambulatory Visit: Payer: Self-pay

## 2023-08-05 ENCOUNTER — Emergency Department (HOSPITAL_COMMUNITY)
Admission: EM | Admit: 2023-08-05 | Discharge: 2023-08-05 | Disposition: A | Discharge status: Home / Self Care | Payer: BC Managed Care – PPO | Attending: Emergency Medicine | Admitting: Emergency Medicine

## 2023-08-05 ENCOUNTER — Encounter (HOSPITAL_COMMUNITY): Payer: Self-pay | Admitting: *Deleted

## 2023-08-05 DIAGNOSIS — R0602 Shortness of breath: Secondary | ICD-10-CM | POA: Insufficient documentation

## 2023-08-05 LAB — DIFFERENTIAL
Abs Immature Granulocytes: 0.01 10*3/uL (ref 0.00–0.07)
Basophils Absolute: 0 10*3/uL (ref 0.0–0.1)
Basophils Relative: 1 %
Eosinophils Absolute: 0.2 10*3/uL (ref 0.0–0.5)
Eosinophils Relative: 6 %
Immature Granulocytes: 0 %
Lymphocytes Relative: 31 %
Lymphs Abs: 1.2 10*3/uL (ref 0.7–4.0)
Monocytes Absolute: 0.3 10*3/uL (ref 0.1–1.0)
Monocytes Relative: 8 %
Neutro Abs: 2 10*3/uL (ref 1.7–7.7)
Neutrophils Relative %: 54 %

## 2023-08-05 LAB — COMPREHENSIVE METABOLIC PANEL
ALT: 14 U/L (ref 0–44)
AST: 17 U/L (ref 15–41)
Albumin: 3.8 g/dL (ref 3.5–5.0)
Alkaline Phosphatase: 38 U/L (ref 38–126)
Anion gap: 10 (ref 5–15)
BUN: 7 mg/dL (ref 6–20)
CO2: 24 mmol/L (ref 22–32)
Calcium: 9.2 mg/dL (ref 8.9–10.3)
Chloride: 103 mmol/L (ref 98–111)
Creatinine, Ser: 0.55 mg/dL (ref 0.44–1.00)
GFR, Estimated: >60 mL/min (ref >60)
Glucose, Bld: 102 mg/dL — ABNORMAL HIGH (ref 70–99)
Potassium: 3.8 mmol/L (ref 3.5–5.1)
Sodium: 137 mmol/L (ref 135–145)
Total Bilirubin: 0.8 mg/dL (ref 0.0–1.2)
Total Protein: 6.8 g/dL (ref 6.5–8.1)

## 2023-08-05 LAB — CBC
HCT: 39.4 % (ref 36.0–46.0)
Hemoglobin: 13 g/dL (ref 12.0–15.0)
MCH: 31.5 pg (ref 26.0–34.0)
MCHC: 33 g/dL (ref 30.0–36.0)
MCV: 95.4 fL (ref 80.0–100.0)
Platelets: 376 10*3/uL (ref 150–400)
RBC: 4.13 MIL/uL (ref 3.87–5.11)
RDW: 12.1 % (ref 11.5–15.5)
WBC: 3.8 10*3/uL — ABNORMAL LOW (ref 4.0–10.5)
nRBC: 0 % (ref 0.0–0.2)

## 2023-08-05 LAB — HCG, SERUM, QUALITATIVE: Preg, Serum: NEGATIVE

## 2023-08-05 LAB — D-DIMER, QUANTITATIVE: D-Dimer, Quant: <0.27 ug{FEU}/mL (ref 0.00–0.50)

## 2023-08-05 LAB — CBG MONITORING, ED: Glucose-Capillary: 89 mg/dL (ref 70–99)

## 2023-08-05 NOTE — Discharge Instructions (Addendum)
 You are seen in the emergency room today for shortness of breath.  Your lab work today is very reassuring.  You have negative D-dimer thus do not feel this is secondary to blood clot.  I recommend finding primary care doctor for follow-up.  You can return to the emergency room if you have any new or worsening symptoms.  Hematology  [address] [place] [address]  [phone]

## 2023-08-05 NOTE — ED Provider Notes (Signed)
 [place] EMERGENCY DEPARTMENT AT [facility] Provider Note   CSN: [id number] Arrival date & time: 08/05/23  4098     History  Chief Complaint  Patient presents with   Shortness of Breath    Aayushi Solorzano is a 38 y.o. female history of fatigue, night sweats, ADHD and Raynaud's syndrome. Patient reports she was sent here by urgent care due to high platelets.  Patient reports she was seen at urgent care yesterday for shortness of breath and bruising on her shin.  Reports platelets were high.  Patient reports shortness of breath has been ongoing for about a month.  Reports she feels shortness of breath is worse with exertion and better with rest.  Denies any cough, fevers, chills.   Shortness of Breath      Home Medications Prior to Admission medications   Medication Sig Start Date End Date Taking? Authorizing Provider  amphetamine-dextroamphetamine (ADDERALL) 20 MG tablet Take 60 mg by mouth daily.    [provider]  FLUoxetine (PROZAC) 20 MG tablet Take 20 mg by mouth daily.    [provider]  LORazepam (ATIVAN) 0.5 MG tablet Take 0.5 mg by mouth every 8 (eight) hours.    [provider]  Multiple Vitamin (MULTIVITAMIN) tablet Take 1 tablet by mouth daily.    [provider]  RIVAROXABAN Carlena Hurl) VTE STARTER PACK (15 & 20 MG TABLETS) Follow package directions: Take one 15mg  tablet by mouth twice a day. On day 22, switch to one 20mg  tablet once a day. Take with food. 11/22/19   Blane Ohara, MD      Allergies    Patient has no known allergies.    Review of Systems   Review of Systems  Respiratory:  Positive for shortness of breath.     Physical Exam Updated Vital Signs BP (!) 123/96 (BP Location: Right Arm)   Pulse 82   Temp 98.2 F (36.8 C) (Oral)   Resp 16   Wt 69.9 kg   LMP 07/21/2023 (Exact Date)   SpO2 98%   BMI 26.43 kg/m  Physical Exam  ED Results / Procedures / Treatments   Labs (all labs ordered are  listed, but only abnormal results are displayed) Labs Reviewed  CBG MONITORING, ED    EKG None  Radiology No results found.  Procedures Procedures    Medications Ordered in ED Medications - No data to display  ED Course/ Medical Decision Making/ A&P                                Medical Decision Making Amount and/or Complexity of Data Reviewed Labs: ordered. Decision-making details documented in ED Course.   [facility] 38 y.o. presented today for shortness of breath.  Working DDx that I considered at this time includes, but not limited to, asthma/COPD exacerbation, URI, viral illness, anemia, ACS, PE, pneumonia, pleural effusion, lung mass.  R/o DDx: These are considered less likely due to history of present illness, physical exam, labs/imaging findings  Pmhx: Raynaud's syndrome, chronic fatigue  Review of prior external notes: Yesterday's urgent care visit 08/04/2023  Unique Tests and My Interpretation:  Will hold off on repeat chest x-ray as patient had 1 yesterday when has not had no change in symptoms.  Denies any cough or flulike symptoms or symptoms concerning for pneumothorax, pneumonia.    Problem List / ED Course / Critical interventions / Medication management  Reported to  emergency room with abnormal labs and shortness of breath.  Patient reports she was evaluated yesterday at urgent care.  Patient has reassuring workup today.  No wheezing send respiratory distress no hypoxia.  She is well-appearing and vitals are stable.  Given reassuring presentation do not feel that she needs to have repeat chest x-ray I have reviewed her chest x-ray from yesterday.  Ordered basic labs as well as D-dimer.  D-dimer is negative thus doubt PE as cause of shortness of breath.  She does not have infectious symptoms thus doubt pneumonia or infectious etiology.  Patient was given albuterol inhaler to use as needed for shortness of breath yesterday urgent care. With reassuring  labs patient stable for discharge and follow-up with primary care.  Offered medications, patient declined.  Reevaluation of the patient after these medicines showed that the patient stayed the same Patients vitals assessed. Upon arrival patient is hemodynamically stable.  I have reviewed the patients home medicines and have made adjustments as needed   Plan:  F/u w/ PCP in 2-3d to ensure resolution of sx.  Patient was given return precautions. Patient stable for discharge at this time.  Patient educated on sx/dx and verbalized understanding of plan. Will return to ER for new or worsening sx.          Final Clinical Impression(s) / ED Diagnoses Final diagnoses:  SOB (shortness of breath)    Rx / DC Orders ED Discharge Orders     None         Smitty Knudsen, PA-C 08/05/23 1409    Gloris Manchester, MD 08/05/23 [phone]

## 2023-08-05 NOTE — ED Triage Notes (Signed)
 BIB family from home for sob, dizzy, easy bruising, and bone pain, sx ongoing for months. Shin pain is significant, 8/10. Seen at [facility] yesterday, called back and instructed to go to ED for high plts, 463. Alert, NAD, calm, interactive, steady gait.
